# Patient Record
Sex: Female | Born: 1965
Health system: Southern US, Community
[De-identification: ages and names within clinical notes are randomized; demographics above are authoritative.]

## PROBLEM LIST (undated history)

## (undated) DIAGNOSIS — K219 Gastro-esophageal reflux disease without esophagitis: Secondary | ICD-10-CM

## (undated) DIAGNOSIS — K635 Polyp of colon: Secondary | ICD-10-CM

## (undated) DIAGNOSIS — R112 Nausea with vomiting, unspecified: Secondary | ICD-10-CM

## (undated) DIAGNOSIS — Z9889 Other specified postprocedural states: Secondary | ICD-10-CM

## (undated) HISTORY — PX: ANTERIOR AND POSTERIOR VAGINAL REPAIR: SUR5

## (undated) HISTORY — PX: BLADDER SUSPENSION: SHX72

## (undated) HISTORY — PX: DILATION AND CURETTAGE OF UTERUS: SHX78

## (undated) HISTORY — PX: WISDOM TOOTH EXTRACTION: SHX21

## (undated) HISTORY — PX: TONSILLECTOMY: SUR1361

## (undated) HISTORY — PX: ABDOMINAL HYSTERECTOMY: SHX81

---

## 2000-01-25 ENCOUNTER — Other Ambulatory Visit: Admission: RE | Admit: 2000-01-25 | Discharge: 2000-01-25 | Payer: Self-pay | Admitting: Obstetrics and Gynecology

## 2000-08-09 ENCOUNTER — Inpatient Hospital Stay (HOSPITAL_COMMUNITY): Admission: AD | Admit: 2000-08-09 | Discharge: 2000-08-11 | Payer: Self-pay | Admitting: Obstetrics and Gynecology

## 2000-10-25 ENCOUNTER — Encounter (INDEPENDENT_AMBULATORY_CARE_PROVIDER_SITE_OTHER): Payer: Self-pay

## 2000-10-25 ENCOUNTER — Other Ambulatory Visit: Admission: RE | Admit: 2000-10-25 | Discharge: 2000-10-25 | Payer: Self-pay | Admitting: Obstetrics and Gynecology

## 2000-11-09 ENCOUNTER — Other Ambulatory Visit: Admission: RE | Admit: 2000-11-09 | Discharge: 2000-11-09 | Payer: Self-pay | Admitting: Obstetrics and Gynecology

## 2000-11-09 ENCOUNTER — Encounter (INDEPENDENT_AMBULATORY_CARE_PROVIDER_SITE_OTHER): Payer: Self-pay

## 2001-03-12 ENCOUNTER — Other Ambulatory Visit: Admission: RE | Admit: 2001-03-12 | Discharge: 2001-03-12 | Payer: Self-pay | Admitting: Obstetrics and Gynecology

## 2001-07-02 ENCOUNTER — Other Ambulatory Visit: Admission: RE | Admit: 2001-07-02 | Discharge: 2001-07-02 | Payer: Self-pay | Admitting: Obstetrics and Gynecology

## 2001-09-24 ENCOUNTER — Encounter (INDEPENDENT_AMBULATORY_CARE_PROVIDER_SITE_OTHER): Payer: Self-pay | Admitting: *Deleted

## 2001-09-24 ENCOUNTER — Observation Stay (HOSPITAL_COMMUNITY): Admission: RE | Admit: 2001-09-24 | Discharge: 2001-09-25 | Payer: Self-pay | Admitting: Obstetrics and Gynecology

## 2002-04-12 ENCOUNTER — Ambulatory Visit (HOSPITAL_COMMUNITY): Admission: RE | Admit: 2002-04-12 | Discharge: 2002-04-12 | Payer: Self-pay | Admitting: Infectious Diseases

## 2002-04-12 ENCOUNTER — Encounter: Payer: Self-pay | Admitting: Infectious Diseases

## 2002-09-23 ENCOUNTER — Other Ambulatory Visit: Admission: RE | Admit: 2002-09-23 | Discharge: 2002-09-23 | Payer: Self-pay | Admitting: Obstetrics and Gynecology

## 2003-09-21 ENCOUNTER — Observation Stay (HOSPITAL_COMMUNITY): Admission: RE | Admit: 2003-09-21 | Discharge: 2003-09-22 | Payer: Self-pay | Admitting: Obstetrics and Gynecology

## 2003-11-03 ENCOUNTER — Other Ambulatory Visit: Admission: RE | Admit: 2003-11-03 | Discharge: 2003-11-03 | Payer: Self-pay | Admitting: Obstetrics and Gynecology

## 2004-07-05 ENCOUNTER — Other Ambulatory Visit: Admission: RE | Admit: 2004-07-05 | Discharge: 2004-07-05 | Payer: Self-pay | Admitting: Obstetrics and Gynecology

## 2005-08-03 ENCOUNTER — Other Ambulatory Visit: Admission: RE | Admit: 2005-08-03 | Discharge: 2005-08-03 | Payer: Self-pay | Admitting: Obstetrics and Gynecology

## 2005-10-31 ENCOUNTER — Ambulatory Visit: Payer: Self-pay | Admitting: Internal Medicine

## 2005-11-27 ENCOUNTER — Ambulatory Visit: Payer: Self-pay | Admitting: Internal Medicine

## 2005-11-27 ENCOUNTER — Ambulatory Visit (HOSPITAL_COMMUNITY): Admission: RE | Admit: 2005-11-27 | Discharge: 2005-11-27 | Payer: Self-pay | Admitting: Internal Medicine

## 2006-04-26 ENCOUNTER — Encounter: Admission: RE | Admit: 2006-04-26 | Discharge: 2006-04-26 | Payer: Self-pay | Admitting: Physical Therapy

## 2009-05-19 ENCOUNTER — Ambulatory Visit: Payer: Self-pay | Admitting: Vascular Surgery

## 2010-10-25 NOTE — Consult Note (Signed)
NEW PATIENT CONSULTATION   Donaghey, Frederick A  DOB:  1966-01-31                                       05/19/2009  ZOXWR#:[id number]   The patient presents today for evaluation of bilateral spider vein  telangiectasia.  She is an otherwise healthy 43-year white female with  progressive areas of spider vein telangiectasia bilaterally.  She  reports these have been present for many years and have become more  progressive in their nature throughout the years.  She denies any pain  associated with these.  She does note that they have been progressive,  both on her thighs and on her calves.  She does not have any  varicosities or history of bleeding or swelling.   PAST HISTORY:  Negative for hypertension, diabetes, cardiac disease.   FAMILY HISTORY:  Positive for some telangiectasia in a maternal  grandmother, otherwise negative.  She does not have any history of  premature atherosclerotic disease.   SOCIAL HISTORY:  She is married with 2 children.  She works as an Charity fundraiser.  She does not smoke or drink alcohol.   REVIEW OF SYSTEMS:  Weight is reported at 184 pounds.  She is 5 feet 5  inches tall.  She denies cardiac, pulmonary, GI or GU dysfunction.   PHYSICAL EXAMINATION:  Well-developed, well-nourished white female,  appearing stated age of 54.  Blood pressure 120/77, pulse 69,  respirations 16.  Her radial and dorsalis pedis pulses are 2+  bilaterally.  Extraocular movements are intact.  Conjunctivae are  normal.  Her musculoskeletal is noted for no major deformities, no  cyanosis and no edema.  Neurologically, she has no focal deficits.  Skin  has no ulcers or rashes.  She does have telangiectasia over her lateral  thighs and calves bilaterally and some lesser so in her  medial areas.    She underwent screening duplex by myself and this showed normal caliber  saphenous vein with no evidence of gross reflux.  She did have reticular  veins in the subcutaneous tissue  feeding these areas of spider vein  telangiectasia.   I discussed the significance of this with the patient.  I explained that  this does not put her at any increased risk for major more significant  venous pathology.  I did explain the treatment options for cosmetic  concerns regarding the appearance of her telangiectasia.  I explained  the option of sclerotherapy which would be done as an outpatient and  also discussed the estimated costs associated with this.  She  understands and will notify us should she wish to pursue this any  further.  She was  given contact information for Clementeen Hoof, RN.   Larina Earthly, M.D.  Electronically Signed   TFE/MEDQ  D:  05/19/2009  T:  05/20/2009  Job:  [id number]

## 2010-10-28 NOTE — H&P (Signed)
[facility]  Patient:    Monica Simpson, REDDITT Visit Number: [id number] MRN: [id number]          Service Type: DSU Location: [id number] [phone] Attending Physician:  Trevor Iha Dictated by:   Trevor Iha, M.D. Admit Date:  09/24/2001                           History and Physical  HISTORY OF PRESENT ILLNESS:   Ms. Monica Simpson is a 45 year old G4, P2, A2 with recurrent cervical dysplasia.  She has had history of abnormal Pap smears, dating back to 1996; has undergone two cryosurgery procedures.  Had recurrence of dysplasia in August 2001, with high-grade squamous dysplasia during pregnancy.  She underwent a LEEP procedure in May 2002, which had negative margins and high-grade dysplasia.  After having a normal Pap smear, returned to low-grade dysplasia in October 2002.  HPV typing at that time showed a high risk HPV type, despite two treatments which were initially successful, but recurrence of high-grade HPV and recurrent dysplasia.  The patient desires a hysterectomy.  Both she and her husband were adamant about the hysterectomy and do not wish to address other treatment options; they wish to be treated with hysterectomy.  She presents today for this.  PAST MEDICAL HISTORY:         Negative.  PAST SURGICAL HISTORY:        Tonsillectomy and wisdom teeth extraction.  PAST OB HISTORY:              She had a full-term stillborn in 1988, otherwise she has had two vaginal deliveries and miscarriage at 10 weeks.  MEDICATIONS:                  None.  ALLERGIES:                    No known drug allergies.  PHYSICAL EXAMINATION:  VITAL SIGNS:                  Blood pressure 110/78.  HEART:                        Regular rate and rhythm.  LUNGS:                        Clear to auscultation bilaterally.  ABDOMEN:                      Nondistended and nontender.  PELVIC:                       Uterus is anteverted, mobile and nontender. Cervix is  grossly within normal limits, status post LEEP procedure.  IMPRESSION AND PLAN:          Recurrent high-grade dysplasia, refractory to several treatments.  After a lengthy discussion with the patient and her husband, they desire hysterectomy.  Plan for laparoscopic-assisted vaginal hysterectomy with preservation of both ovaries and tubes.  The risks and benefits were discussed at length with the patient and her husband.  These include, but not limited to, risks of infection, bleeding, damage to ureters, tubes, ovaries, bowel or bladder, risks associated with blood loss and blood transfusion; also, the risk of recurrent vaginal dysplasia after removal of the uterus.  They did give their informed consent. Dictated by:  Trevor Iha, M.D. Attending Physician:  Trevor Iha DD:  09/24/01 TD:  09/24/01 Job: [id number]

## 2010-10-28 NOTE — H&P (Signed)
NAME:  Monica Simpson, Monica Simpson                        ACCOUNT NO.:  [id number]   MEDICAL RECORD NO.:  [id number]                   PATIENT TYPE:  AMB   LOCATION:  DAY                                  FACILITY:  [facility]   PHYSICIAN:  Dineen Kid. Rana Snare, M.D.                 DATE OF BIRTH:  January 04, 1966   DATE OF ADMISSION:  DATE OF DISCHARGE:                                HISTORY & PHYSICAL   HISTORY OF PRESENT ILLNESS:  Monica Simpson is a 45 year old G4, P2, A2, with  symptomatic pelvic relaxation and stress urinary incontinence who presents  for surgical treatment of this.  She previously had a hysterectomy in 2003  for recurrent dysplasia at which time she did not have any symptoms of  urinary incontinence or of pelvic relaxation, but subsequently has developed  that over the last two years.  It has gotten to a point where she is having  to wear a pad on a daily basis and describes pressure in the vascular area  after standing for prolonged periods of time as well as with lifting or  straining.  She has been evaluated by Dr. Retta Diones for stress urinary  incontinence which originally appeared to be mixed, but mostly stress  urinary incontinence and planned to perform a SPARC procedure.  Because of  her cystocele and rectocele we plan on doing this as a combined procedure.   PAST MEDICAL HISTORY:  Negative.   PAST SURGICAL HISTORY:  1. Tonsillectomy.  2. Wisdom teeth extraction.  3. Laparoscopically assisted vaginal hysterectomy.   MEDICATIONS:  None.   ALLERGIES:  She has no known drug allergies.   PHYSICAL EXAMINATION:  VITAL SIGNS:  Her blood pressure is 102/64.  HEART:  Regular rate and rhythm.  LUNGS:  Clear to auscultation bilaterally.  ABDOMEN:  Nondistended, nontender.  PELVIC:  Normal external genitalia.  __________urethra.  The vagina shows a  third degree cystocele with loss of the UV angle with Valsalva.  She has  minimal descensus of the vascular apex, first degree rectocele,  and good  rectal and perianal tone.   IMPRESSION/PLAN:  1. Symptomatic pelvic relaxation with stress urinary incontinence.     Discussed the procedure at length with her which includes anterior and     posterior colporrhaphy.  Dr. Retta Diones is performing a urinary suspension     procedure and she has been evaluated by him and considered by him as     well.  Discussed the risks and benefits of the procedure which include,     but not limited to, risk of infection, bleeding, damage to bowel,     bladder, ureters, rectum, risk of blood loss, risk associated with     anesthesia, risk associated with blood transfusion, such as     hepatitis A.  There is also a risk that this procedure may not last.  It     could become a recurrent  problem.  We also discussed the fact that she     may need to avoid heavy lifting or straining in the future.  All of her     questions were answered and informed consent was obtained.                                               Dineen Kid Rana Snare, M.D.    DCL/MEDQ  D:  09/20/2003  T:  09/20/2003  Job:  [id number]

## 2010-10-28 NOTE — H&P (Signed)
Monica Simpson, Monica Simpson              ACCOUNT NO.:  [id number]   MEDICAL RECORD NO.:  [id number]           PATIENT TYPE:   LOCATION:                                 FACILITY:   PHYSICIAN:  Lionel December, M.D.    DATE OF BIRTH:  25-May-1966   DATE OF ADMISSION:  10/31/2005  DATE OF DISCHARGE:  LH                                HISTORY & PHYSICAL   CHIEF COMPLAINT:  Diarrhea, abdominal cramping, family history of colon  cancer and polyps.   HISTORY OF PRESENT ILLNESS:  Monica Simpson is a 45 year old Caucasian female who  presents as a self-referral for further evaluation of significant family  history of colorectal cancer and colonic polyps as well as a chronic history  of predominance of diarrhea.  Sometimes, she does have some constipation but  generally she has three or four loose stools daily.  Denies any melena or  rectal bleeding.  She has abdominal cramping relieved with defecation. No  nausea or vomiting, heartburn or indigestion.  No recent weight loss.  She  has never had a colonoscopy or work-up of her diarrhea.  No dysphagia or  odynophagia.   FAMILY HISTORY:  Quite significant for colorectal cancer and polyps.  Her  mother's sister was diagnosed with colon cancer at age 45 and died of  metastatic disease at age 100.  Her mother started having her colonoscopies  in her mid 36's and has always had multiple polyps removed.  Maternal great-  grandmother died of colon cancer.  Her brother has had polyps removed at age  36.  She has had multiple cousins with polyps at young ages.   SOCIAL HISTORY:  She is married. 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. [employer].  She has never been a smoker.  No alcohol use.  She has  two living children.   REVIEW OF SYSTEMS:  See HPI for GI and constitutional.  CARDIOPULMONARY:  No  chest pain or shortness of breath.   PHYSICAL EXAMINATION:  GENERAL APPEARANCE:  A pleasant, well-developed, well-  nourished Caucasian female in no acute  distress.  VITAL SIGNS:  Weight 206.5, height 5 feet 6 inches, temperature 98.2, blood  pressure 116/70, pulse 64.  SKIN:  Warm and dry, no jaundice.  HEENT:  Pupils are equal, round and reactive to light.  Conjunctivae are  pink.  Sclerae are nonicteric.  Oropharyngeal mucosa moist and pink.  No  lesions, erythema or exudates.  NECK:  No lymphadenopathy or thyromegaly.  CHEST:  Lungs are clear to auscultation.  CARDIOVASCULAR:  Regular rate and rhythm, normal S1 and S2, no murmurs,  rubs, or gallops.  ABDOMEN:  Positive bowel sounds, soft and nontender, nondistended, no  organomegaly or masses.  No rebound tenderness or guarding.  No abdominal  bruits or hernias.  EXTREMITIES:  No edema.   IMPRESSION:  Monica Simpson is a 45 year old lady with chronic predominance of  diarrhea, sometimes constipation and a significant family history of  colorectal cancer and colonic polyps.  She has not had prior work-up before.  Symptoms have been stable over  the years but she is quite concerned about  the possibility of her developing colorectal polyps or colorectal cancer  given significant family history.   PLAN:  1.  Colonoscopy in the near future with Dr. Karilyn Cota.  2.  She may continue using Imodium p.r.n. as needed for diarrhea.  3.  Further recommendations to follow.      Tana Coast, P.A.      Lionel December, M.D.  Electronically Signed    LL/MEDQ  D:  10/31/2005  T:  10/31/2005  Job:  [id number]   cc:   Doreen Beam  Fax: [phone]

## 2010-10-28 NOTE — Op Note (Signed)
[facility]  Patient:    Monica Simpson, Monica Simpson Visit Number: [id number] MRN: [id number]          Service Type: DSU Location: [phone] Attending Physician:  Trevor Iha Dictated by:   Trevor Iha, M.D. Proc. Date: 09/24/01 Admit Date:  09/24/2001                             Operative Report  PREOPERATIVE DIAGNOSIS:       Recurrent cervical dysplasia.  POSTOPERATIVE DIAGNOSIS:      Recurrent cervical dysplasia.  PROCEDURE:                    Laparoscopically assisted vaginal hysterectomy.  SURGEON:                      Trevor Iha, M.D.  ASSISTANT:                    Luvenia Redden, M.D.  ANESTHESIA:                   General endotracheal.  INDICATIONS:                  The patient is a 45 year old G4, P2 with two living children who has had recurrent cervical dysplasia dating back to 1996 refractory to cryosurgery and LEEP procedure.  This is high risk HPV type. She presents today for definitive surgical intervention and treatment with a planned laparoscopically assisted vaginal hysterectomy.  The risks and benefits were discussed at length and informed consent was obtained.  See the history and physical for further details.  FINDINGS AT THE TIME OF SURGERY:              Normal tubes, ovaries, uterus, appendix and liver.  DESCRIPTION OF PROCEDURE:     After adequate analgesia, the patient was placed in the dorsal lithotomy position.  She was sterilely prepped and draped. T he bladder was sterilely drained.  A Graves speculum was placed.  A tenaculum was placed on the anterior lip of the cervix.  A 1 cm infraumbilical skin incision was made.  A Veress needle was inserted.  The abdomen was insufflated to dullness to percussion.  A 5 mm port was placed left of midline two fingerbreadths above the pubic symphysis.  A 5 mm trocar was inserted with direct visualization.  The above findings were noted.  A tripolar  instrument was placed and cautery was performed across the utero-ovarian ligament, the round ligament and the fallopian tube.  This was taken down to the base of the broad ligament.  This was done bilaterally, with care taken to avoid underlying bowel.  Good hemostasis was achieved at all levels.  At this time, the bladder was elevated.  Sharp Endoshears were used to dissect the bladder off the anterior surface of the cervix.  At this time, the abdomen was desufflated.  The legs were elevated.  A weighted speculum was placed.  A posterior colpotomy was performed.  The cervix was circumscribed with Bovie cautery.  A LigaSure instrument was used across the uterosacral ligaments bilateral, across the cardinal ligaments and across the uterine vasculature. We encountered some bleeding due to the fact that the first LigaSure instrument did not work properly.  After replacing the instrument, we had good seal and good hemostasis at that time.  The bladder was then  dissected off the anterior surface of the cervix.  A Deaver retractor was placed underneath the bladder.  The bladder pillars were then dissected with the LigaSure instrument and cut.  The remaining portions of the uterine vasculature were sealed with the LigaSure instrument and cut.  At this time, the fundus of the uterus was delivered into the introitus.  The remaining pedicle was then ligated and cut and the uterus was removed intact.  At this time, the uterosacral ligament were identified bilaterally and suture ligated with figure-of-eight of 0 Monocryl.  A small ______ pack was placed, packing the bowel cephalad.  The posterior peritoneum was then closed in a pursestring fashion.  The posterior vaginal mucosa was then closed in a vertical fashion using figure-of-eight of 0 Monocryl with good approximation and good hemostasis.  The packing was then removed.  The anterior vaginal mucosa was then closed with figure-of-eight of 0  Monocryl in a vertical fashion with good approximation and good hemostasis. A Foley catheter was placed with good return of clear, yellow urine. Reinsufflation of the abdomen was performed and examination using the laparoscope revealed some small areas of peritoneal edge bleeding, which were cauterized with bipolar cautery.  After adequate hemostasis was assured, the pedicles were re-examined and good hemostasis had been assured.  The abdomen was desufflated.  One last look after the pneumoperitoneum was removed again showed good hemostasis.  The trocars were then removed.  The infraumbilical skin incision was closed with 0 Vicryl UR needle in the fascia in a figure-of-eight, then a 3-0 Vicryl Rapide subcuticular stitch in the skin. The 5 mm port was closed with 3-0 Vicryl Rapide subcuticular stitch.  The incisions were injected with 0.25% Marcaine.  The patient was stable on transfer to the recovery room.  Sponge, needle and instrument counts were normal x3.  The patient received 1 g of cefotetan preoperatively.  Estimated blood loss was 400 cc. Dictated by:   Trevor Iha, M.D. Attending Physician:  Trevor Iha DD:  09/24/01 TD:  09/24/01 Job: [phone]

## 2010-10-28 NOTE — Op Note (Signed)
NAME:  Monica Simpson, Monica Simpson                        ACCOUNT NO.:  [id number]   MEDICAL RECORD NO.:  [id number]                   PATIENT TYPE:  AMB   LOCATION:  DAY                                  [facility]ACILITY[facility]  [facility]   PHYSICIAN:  Bertram Millard. Dahlstedt, M.D.          DATE OF BIRTH:  Oct 13, 1965   DATE OF PROCEDURE:  09/21/2003  DATE OF DISCHARGE:                                 OPERATIVE REPORT   PREOPERATIVE DIAGNOSIS:  Stress urinary incontinence.   POSTOPERATIVE DIAGNOSIS:  Stress urinary incontinence.   PRINCIPAL PROCEDURE:  Obturator tape sling.   SURGEON:  Bertram Millard. Dahlstedt, M.D.   ANESTHESIA:  General endotracheal.   COMPLICATIONS:  None.   BRIEF HISTORY:  This nice 45 year old female was referred by Dr. Rana Snare for  evaluation and management of stress urinary incontinence.  She was scheduled  to have an anterior and posterior repair.  The patient had a brief  evaluation for her incontinence.  She does not have overriding urgency  symptoms.   The patient desires surgical management of her stress incontinence.  She was  explained the surgical repair, specifically minimally invasive surgery. She  is aware of the risks and complications.  These include but are not limited  to erosion of the tape sling through the vaginal wall, bleeding, infection,  retention with need for repeat procedure, injury to the urinary tract among  others.  She understands these and desires to proceed.   DESCRIPTION OF PROCEDURE:  The patient had been administered a general  anesthetic and had undergone the anterior and posterior repair by Dr. Rana Snare.  He left the anterior vaginal wall, at least the distal portion of it,  unsutured.  With the patient having already been placed in the dorsal  lithotomy position, I palpated the tendon from the adductor longus,  attaching to the pubis.  I then made puncture wounds just underneath of  these bilaterally, lateral to the pubic bone.  This was approximately 5 cm  lateral to the clitoris on either side.  Dissection was then carried bluntly  through the vaginal wall laterally on each side underneath the pubic bone.  The helical needles were then passed bilaterally through the puncture sites.  These were easily guided through the vaginal incision.  I then used a  cystoscope to view the bladder and the urethra.  No injury was seen.  The  obturator tape was then passed through the eye of the needles bilaterally  and brought through the puncture incisions.  Approximately 1 cm of slack was  left underneath the mid urethra, between the mid urethra and the tape.  The  tape was then cut and buried in the subcutaneous tissue bilaterally.  Skin  wounds were closed with Dermabond.  The vaginal incision was then closed  with a running 2-0 Monocryl.  A vaginal pack was placed with Estrace cream.  No bleeding was seen prior to closure of the wound.  The patient tolerated the procedure well.  She was awakened and transferred  to the PACU in stable condition.   She will be left with a catheter over night and will have a voiding trial in  the morning.                                               Bertram Millard. Dahlstedt, M.D.    SMD/MEDQ  D:  09/21/2003  T:  09/21/2003  Job:  [id number]   cc:   Dineen Kid. Rana Snare, M.D.  [address]  [place]  [place] [address]  Fax: [phone]

## 2010-10-28 NOTE — Op Note (Signed)
[facility]  Patient:    Monica Simpson, Monica Simpson                       MRN: [id number] Proc. Date: 08/09/00 Adm. Date:  [id number] Disc. Date: [id number] Attending:  Trevor Iha                           Operative Report  DELIVERY NOTE  Ms. Buffalo pushed for an approximate hour, hour and 15 minutes with descent to right occiput anterior position and +2 station. For maternal exhaustion, the patient is requesting vacuum extractor. Risk and benefits of the VE were discussed at length. Informed consent was obtained.  The Mushroom Vacuum Extractor was placed easily at a +2 station and with one pull the vertex was easily delivered, at which time turtle sign was encountered. The vacuum was removed. McRoberts maneuver was performed and a Woods screw maneuver was also performed without releasing of the impacted shoulder. The operators hand was placed in to deliver the posterior arm and the infant then delivered and a good cry was noted. Apgars were 8 and 9. The placenta delivered spontaneously intact with three-vessel cord. Cervix and vagina were without lacerations. The mother and baby were in good condition after the delivery. The estimated blood loss was 400 cc. DD:  08/09/00 TD:  08/11/00 Job: [id number]

## 2010-10-28 NOTE — Op Note (Signed)
Monica Simpson, Monica Simpson              ACCOUNT NO.:  [id number]   MEDICAL RECORD NO.:  [id number]          PATIENT TYPE:  AMB   LOCATION:  DAY                           FACILITY:  [facility]   PHYSICIAN:  Lionel December, M.D.    DATE OF BIRTH:  01/20/1966   DATE OF PROCEDURE:  11/27/2005  DATE OF DISCHARGE:                                 OPERATIVE REPORT   PROCEDURE:  Colonoscopy with terminal ileoscopy.   INDICATIONS:  Monica Simpson is 45 year old Caucasian female with chronic diarrhea  suspected to be due to IBS, who has family history of colonic polyps.  Her  mother and brother have had multiple polyps removed; and maternal aunt was  diagnosed with colon carcinoma at age 16 and died of metastatic disease a  few weeks later.  Multiple second-degree relatives have had colonic polyps.  She is undergoing colonoscopy both for diagnostic and high-risk screening  purpose purposes.   Procedure and risks were reviewed with the patient and informed consent was  obtained.   MEDS FOR CONSCIOUS SEDATION:  Demerol 50 mg IV, Versed 5 mg IV.   FINDINGS:  Procedure performed in endoscopy suite.  The patient's vital  signs and O2 saturations were monitored during the procedure and remained  stable.  The patient was placed in the left lateral position and rectal  examination performed.  No abnormality noted on external or digital exam.  Olympus videoscope was placed in the rectum and advanced, under vision, into  sigmoid colon and beyond.  Preparation was excellent.  Scope was passed into  the cecum which was identified by ileocecal valve and appendiceal orifice.  Pictures taken for the record.  A short segment of TI was also examined and  was normal.   As the scope was withdrawn, colonic mucosa was examined for the second time;  and was normal throughout.  No diverticular changes were noted.  Rectal  mucosa was normal.  Scope was retroflexed to examine anorectal junction  which was unremarkable.  Endoscope was  straightened and withdrawn.  Patient  tolerated the procedure well.   Normal colonoscopy and terminal ileoscopy.   RECOMMENDATIONS:  1.  High-fiber diet.  2.  Levbid 1/2 to 1 tablet every morning, prescription given for 30 with      five refills.  If Levbid does not help with her diarrhea and abdominal      cramps; she will let us know.  3.  Yearly Hemoccults; and consider next exam in 5 years from now.      Lionel December, M.D.  Electronically Signed     NR/MEDQ  D:  11/27/2005  T:  11/27/2005  Job:  [id number]   cc:   Doreen Beam  Fax: [phone]

## 2010-10-28 NOTE — Discharge Summary (Signed)
[facility]  Patient:    Monica Simpson, Monica Simpson Visit Number: [id number] MRN: [id number]          Service Type: DSU Location: [id number] Attending Physician:  Trevor Iha Dictated by:   Trevor Iha, M.D. Admit Date:  09/24/2001 Discharge Date: 09/25/2001                             Discharge Summary  HISTORY OF PRESENT ILLNESS:  Monica Simpson is a 45 year old, G4, P2, with two living children who has recurrent cervical dysplasia dating back to 1996, refractory to prior surgery and loop electrosurgical excision procedure.  She does have high risk HPD type.  She presents for definitive surgical intervention and treatment with a planned laparoscopic assisted vaginal hysterectomy.  See history and physical for further details.  HOSPITAL COURSE:  The patient underwent a laparoscopic assisted vaginal hysterectomy.  The surgery was uncomplicated.  At the time of surgery, normal tubes, uterus, ovaries, appendix, and liver were noted.  She did have an estimated blood loss of 400 cc.  The postoperative care was unremarkable.  She had a good return of bowel function, ambulation.  Postoperative hemoglobin is 11.6.  She remained afebrile, and on postoperative day #1, was tolerating a regular diet, ambulating without difficulty, passing flatus, had normal active bowel sounds, the incisions were clean, dry, and intact.  DISPOSITION:  The patient is discharged home.  FOLLOWUP:  In the office in one to two weeks.  DISCHARGE MEDICATIONS:  Tylox #30.  DISCHARGE INSTRUCTIONS:  Told to return for increased pain, fever, bleeding.  CONDITION ON DISCHARGE:  Good. Dictated by:   Trevor Iha, M.D. Attending Physician:  Trevor Iha DD:  09/25/01 TD:  09/26/01 Job: [id number]

## 2010-10-28 NOTE — Op Note (Signed)
NAME:  Monica Simpson, Monica Simpson                        ACCOUNT NO.:  [id number]   MEDICAL RECORD NO.:  [id number]                   PATIENT TYPE:  AMB   LOCATION:  DAY                                  FACILITY:  [facility]   PHYSICIAN:  Dineen Kid. Rana Snare, M.D.                 DATE OF BIRTH:  April 13, 1966   DATE OF PROCEDURE:  09/21/2003  DATE OF DISCHARGE:                                 OPERATIVE REPORT   PREOPERATIVE DIAGNOSES:  Symptomatic pelvic relaxation with cystocele and  rectocele and stress urinary incontinence.   POSTOPERATIVE DIAGNOSES:  Symptomatic pelvic relaxation with cystocele and  rectocele and stress urinary incontinence.   PROCEDURE:  Anterior and posterior colporrhaphy with urinary incontinence  procedure performed by Dr. Retta Diones and dictated elsewhere.   SURGEON:  Dr. Candice Camp.   ASSISTANT:  Dr. Vincente Poli.   ANESTHESIA:  General endotracheal.   INDICATIONS:  Ms. Rochefort is a 45 year old, G3, P2, status post hysterectomy  2 years ago with worsening pelvic pressure with a known cystocele,  rectocele, and also now with stress urinary incontinence.  She presents for  definitive surgical evaluation and treatment and planned anterior and  posterior repair with obturator sling per Dr. Retta Diones.  Risks and benefits  were discussed at length.  Informed consent was obtained.  See history and  physician for further details.   FINDINGS AT TIME OF SURGERY:  A third-degree cystocele and a second-degree  rectocele with loss of UV angle.   DESCRIPTION OF PROCEDURE:  After adequate analgesia, the patient was placed  in the dorsal lithotomy position.  She was sterilely prepped and draped.  The bladder was sterilely drained.  Apex of the vagina was grasped with  Allis clamps.  Mid portion was dissected using Metzenbaum scissors, and the  anterior vaginal mucosa was reflected laterally up to approximately 1.5 cm  in the urethral meatus.  __________ reflected laterally, the cystocele was  reduced by placating the pubic and fascicular cervical fascia near the  __________ with figure-of-eight of 0 Monocryl suture up to the urethra.  Excess vaginal mucosa was then excised, and the anterior vaginal mucosa was  then closed with a combination of running suture of 0 Monocryl suture and  then interrupted sutures of 0 Monocryl suture near the cystourethrocele.  The posterior repair was begun by placing Allis clamps at the edge of the  hymenal ring, making a triangular flap across the perineal body, dissecting  it in midline of the posterior vaginal mucosa, reflecting laterally off the  perirectal fascia.  The perirectal fascia was then plicated in the midline  using figure-of-eight of 0 Monocryl suture.  The excess vaginal mucosa was  excised, and the posterior repair was closed with 2-0 Monocryl suture in a  running locking fashion.  The bulbocavernosus with superficial transverse  perineum was sutured and plicated with 0 Monocryl suture in a figure-of-  eight, and the perineal body was  then closed with 2-0 Monocryl suture in the  subcuticular fashion with good approximation and good hemostasis noted.  Pelvic exam at this point of the procedure revealed good cystocele support  and also rectocele support.  The procedure was then  turned over to Dr. Retta Diones for the urinary incontinence procedure.  The  estimated blood loss to this point was 100 mL.  The patient tolerated the  procedure well, was stable up to this point.  The sponge and instrument  count was normal x 3.                                               Dineen Kid Rana Snare, M.D.    DCL/MEDQ  D:  09/21/2003  T:  09/21/2003  Job:  [id number]

## 2010-12-15 ENCOUNTER — Other Ambulatory Visit: Payer: Self-pay | Admitting: Dermatology

## 2011-01-18 ENCOUNTER — Telehealth (INDEPENDENT_AMBULATORY_CARE_PROVIDER_SITE_OTHER): Payer: Self-pay | Admitting: *Deleted

## 2011-01-18 NOTE — Telephone Encounter (Signed)
TCS sch'd 02/23/11 at 2:00 (1:00), movi prep instr mailed

## 2011-02-09 ENCOUNTER — Encounter (INDEPENDENT_AMBULATORY_CARE_PROVIDER_SITE_OTHER): Payer: Self-pay | Admitting: Internal Medicine

## 2011-02-22 MED ORDER — SODIUM CHLORIDE 0.45 % IV SOLN
Freq: Once | INTRAVENOUS | Status: AC
  Administered 2011-02-23: 14:00:00 via INTRAVENOUS

## 2011-02-23 ENCOUNTER — Other Ambulatory Visit (INDEPENDENT_AMBULATORY_CARE_PROVIDER_SITE_OTHER): Payer: Self-pay | Admitting: Internal Medicine

## 2011-02-23 ENCOUNTER — Encounter (HOSPITAL_COMMUNITY)
Admission: RE | Disposition: A | Discharge status: Home / Self Care | Payer: Self-pay | Source: Ambulatory Visit | Attending: Internal Medicine

## 2011-02-23 ENCOUNTER — Encounter (HOSPITAL_COMMUNITY): Payer: Self-pay | Admitting: *Deleted

## 2011-02-23 ENCOUNTER — Ambulatory Visit (HOSPITAL_COMMUNITY)
Admission: RE | Admit: 2011-02-23 | Discharge: 2011-02-23 | Disposition: A | Discharge status: Home / Self Care | Payer: 59 | Source: Ambulatory Visit | Attending: Internal Medicine | Admitting: Internal Medicine

## 2011-02-23 DIAGNOSIS — Z1211 Encounter for screening for malignant neoplasm of colon: Secondary | ICD-10-CM

## 2011-02-23 DIAGNOSIS — D126 Benign neoplasm of colon, unspecified: Secondary | ICD-10-CM | POA: Insufficient documentation

## 2011-02-23 DIAGNOSIS — Z800 Family history of malignant neoplasm of digestive organs: Secondary | ICD-10-CM

## 2011-02-23 HISTORY — PX: COLONOSCOPY: SHX5424

## 2011-02-23 SURGERY — COLONOSCOPY
Anesthesia: Moderate Sedation

## 2011-02-23 MED ORDER — MIDAZOLAM HCL 5 MG/5ML IJ SOLN
INTRAMUSCULAR | Status: DC | PRN
  Administered 2011-02-23: 2 mg via INTRAVENOUS
  Administered 2011-02-23: 1 mg via INTRAVENOUS
  Administered 2011-02-23: 2 mg via INTRAVENOUS

## 2011-02-23 MED ORDER — MEPERIDINE HCL 50 MG/ML IJ SOLN
INTRAMUSCULAR | Status: AC
  Filled 2011-02-23: qty 1

## 2011-02-23 MED ORDER — MIDAZOLAM HCL 5 MG/5ML IJ SOLN
INTRAMUSCULAR | Status: AC
  Filled 2011-02-23: qty 10

## 2011-02-23 MED ORDER — STERILE WATER FOR IRRIGATION IR SOLN
Status: DC | PRN
  Administered 2011-02-23: 14:00:00

## 2011-02-23 MED ORDER — MEPERIDINE HCL 50 MG/ML IJ SOLN
INTRAMUSCULAR | Status: DC | PRN
  Administered 2011-02-23 (×2): 25 mg via INTRAVENOUS

## 2011-02-23 NOTE — Op Note (Signed)
COLONOSCOPY PROCEDURE REPORT  PATIENT:  Monica Simpson  MR#:  [id number] Birthdate:  1966-02-28, 45 y.o., female Endoscopist:  Dr. Malissa Hippo, MD Referred By:  Dr. Ignatius Specking, MD Procedure Date: 02/23/2011  Procedure:   Colonoscopy  Indications:  Family history of colonic polyps and colon carcinoma.  Informed Consent: Procedure and risks were reviewed with the patient and informed consent was obtained.  Medications:  Demerol 50 mg IV Versed 5 mg IV  Description of procedure:  After a digital rectal exam was performed, that colonoscope was advanced from the anus through the rectum and colon to the area of the cecum, ileocecal valve and appendiceal orifice. The cecum was deeply intubated. These structures were well-seen and photographed for the record. Short segment of terminal ileum was also examined From the level of the cecum and ileocecal valve, the scope was slowly and cautiously withdrawn. The mucosal surfaces were carefully surveyed utilizing scope tip to flexion to facilitate fold flattening as needed. The scope was pulled down into the rectum where a thorough exam including retroflexion was performed.  Findings:   Prep excellent. Normal terminal ileum. 2 mm polyp ablated via cold biopsy from ascending colon. Rest of the mucosa was normal. Unremarkable anorectal junction.  Therapeutic/Diagnostic Maneuvers Performed:  See above  Complications:  None  Cecal Withdrawal Time:  14 minutes  Impression:  Normal terminal ileum. 2 mm polyp ablated via cold biopsy from ascending colon. Rest of exam was normal.   Recommendations:  Standard instructions given. Pthysician will contact patient with results of biopsy. Next exam in 5 years.  Zahniya Zellars U  02/23/2011 2:23 PM  CC: Dr. Ignatius Specking., MD, MD

## 2011-02-23 NOTE — H&P (Signed)
Monica Simpson is an 45 y.o. female.   Chief Complaint: Patient is here for colonoscopy. HPI: Monica Simpson is a 45 year old Caucasian female who is in for a screening colonoscopy. Patient's last exam was in June 2007 and was normal. At that time she was having diarrhea felt to be secondary to IBS. He denies abdominal pain or rectal bleeding. She has occasional diarrhea nothing like before. Family history is positive for multiple colonic polyps in her mother and brother. She lost her maternal onset of metastatic colon carcinoma in her 69s she was diagnosed at age 2. Great grandmother on mother's side also had colon carcinoma.  History reviewed. No pertinent past medical history.  Past Surgical History  Procedure Date  . Abdominal hysterectomy   . Tonsillectomy   . Wisdom tooth extraction   . Dilation and curettage of uterus   . Anterior and posterior vaginal repair     History reviewed. No pertinent family history. Social History:  reports that she has never smoked. She does not have any smokeless tobacco history on file. She reports that she does not drink alcohol or use illicit drugs.  Allergies:  Allergies  Allergen Reactions  . Morphine And Related Nausea And Vomiting    Medications Prior to Admission  Medication Dose Route Frequency Provider Last Rate Last Dose  . 0.45 % sodium chloride infusion   Intravenous Once Monica Hippo, MD 20 mL/hr at 02/23/11 1337    . meperidine (DEMEROL) 50 MG/ML injection           . midazolam (VERSED) 5 MG/5ML injection            No current outpatient prescriptions on file as of 02/23/2011.    No results found for this or any previous visit (from the past 48 hour(s)). No results found.  Review of Systems  Constitutional: Negative for weight loss.  Gastrointestinal: Negative for abdominal pain, constipation, blood in stool and melena. Diarrhea: occasional.    Blood pressure 106/62, pulse 71, temperature 97.9 F (36.6 C), temperature source  Oral, resp. rate 18, height 5\' 6"  (1.676 m), weight 207 lb (93.895 kg), SpO2 97.00%. Physical Exam  Constitutional: She appears well-developed and well-nourished.  HENT:  Mouth/Throat: Oropharynx is clear and moist.  Eyes: Conjunctivae are normal. No scleral icterus.  Neck: No thyromegaly present.  Cardiovascular: Normal rate, regular rhythm and normal heart sounds.   No murmur heard. Respiratory: Effort normal and breath sounds normal.  GI: Soft. She exhibits no distension and no mass. There is no tenderness.  Musculoskeletal: She exhibits no edema.  Lymphadenopathy:    She has no cervical adenopathy.  Neurological: She is alert.  Skin: Skin is warm and dry.     Assessment/Plan High-risk screening colonoscopy.  Monica Simpson U 02/23/2011, 1:52 PM

## 2011-03-02 ENCOUNTER — Encounter (HOSPITAL_COMMUNITY): Payer: Self-pay | Admitting: Internal Medicine

## 2011-03-07 ENCOUNTER — Encounter (INDEPENDENT_AMBULATORY_CARE_PROVIDER_SITE_OTHER): Payer: Self-pay | Admitting: *Deleted

## 2011-04-19 ENCOUNTER — Other Ambulatory Visit: Payer: Self-pay | Admitting: Obstetrics and Gynecology

## 2014-06-03 ENCOUNTER — Other Ambulatory Visit: Payer: Self-pay | Admitting: Obstetrics and Gynecology

## 2014-06-03 DIAGNOSIS — R928 Other abnormal and inconclusive findings on diagnostic imaging of breast: Secondary | ICD-10-CM

## 2014-06-18 ENCOUNTER — Ambulatory Visit
Admission: RE | Admit: 2014-06-18 | Discharge: 2014-06-18 | Disposition: A | Discharge status: Home / Self Care | Payer: 59 | Source: Ambulatory Visit | Attending: Obstetrics and Gynecology | Admitting: Obstetrics and Gynecology

## 2014-06-18 DIAGNOSIS — R928 Other abnormal and inconclusive findings on diagnostic imaging of breast: Secondary | ICD-10-CM

## 2014-12-31 ENCOUNTER — Other Ambulatory Visit: Payer: Self-pay | Admitting: Obstetrics and Gynecology

## 2015-01-01 LAB — CYTOLOGY - PAP

## 2015-12-28 DIAGNOSIS — H524 Presbyopia: Secondary | ICD-10-CM | POA: Diagnosis not present

## 2015-12-28 DIAGNOSIS — H5203 Hypermetropia, bilateral: Secondary | ICD-10-CM | POA: Diagnosis not present

## 2015-12-28 DIAGNOSIS — H52221 Regular astigmatism, right eye: Secondary | ICD-10-CM | POA: Diagnosis not present

## 2016-01-20 ENCOUNTER — Other Ambulatory Visit (INDEPENDENT_AMBULATORY_CARE_PROVIDER_SITE_OTHER): Payer: Self-pay | Admitting: *Deleted

## 2016-01-20 DIAGNOSIS — Z800 Family history of malignant neoplasm of digestive organs: Secondary | ICD-10-CM

## 2016-01-20 DIAGNOSIS — Z86010 Personal history of colonic polyps: Secondary | ICD-10-CM | POA: Insufficient documentation

## 2016-02-28 ENCOUNTER — Encounter (INDEPENDENT_AMBULATORY_CARE_PROVIDER_SITE_OTHER): Payer: Self-pay | Admitting: *Deleted

## 2016-03-17 DIAGNOSIS — R5383 Other fatigue: Secondary | ICD-10-CM | POA: Diagnosis not present

## 2016-03-17 DIAGNOSIS — Z299 Encounter for prophylactic measures, unspecified: Secondary | ICD-10-CM | POA: Diagnosis not present

## 2016-03-17 DIAGNOSIS — Z0000 Encounter for general adult medical examination without abnormal findings: Secondary | ICD-10-CM | POA: Diagnosis not present

## 2016-03-17 DIAGNOSIS — Z79899 Other long term (current) drug therapy: Secondary | ICD-10-CM | POA: Diagnosis not present

## 2016-03-17 DIAGNOSIS — Z789 Other specified health status: Secondary | ICD-10-CM | POA: Diagnosis not present

## 2016-04-19 DIAGNOSIS — Z6836 Body mass index (BMI) 36.0-36.9, adult: Secondary | ICD-10-CM | POA: Diagnosis not present

## 2016-04-19 DIAGNOSIS — Z01419 Encounter for gynecological examination (general) (routine) without abnormal findings: Secondary | ICD-10-CM | POA: Diagnosis not present

## 2016-05-17 ENCOUNTER — Telehealth (INDEPENDENT_AMBULATORY_CARE_PROVIDER_SITE_OTHER): Payer: Self-pay | Admitting: *Deleted

## 2016-05-17 ENCOUNTER — Encounter (INDEPENDENT_AMBULATORY_CARE_PROVIDER_SITE_OTHER): Payer: Self-pay | Admitting: *Deleted

## 2016-05-17 MED ORDER — PEG 3350-KCL-NA BICARB-NACL 420 G PO SOLR: 4000.0000 mL | Freq: Once | ORAL | 0 refills | Status: AC

## 2016-05-17 NOTE — Telephone Encounter (Signed)
Referring MD/PCP: vyas   Procedure: tcs  Reason/Indication:  fam hx colon ca, hx polyps  Has patient had this procedure before?  Yes, 2012  If so, when, by whom and where?    Is there a family history of colon cancer?  Yes, aunt  Who?  What age when diagnosed?    Is patient diabetic?   no      Does patient have prosthetic heart valve or mechanical valve?  no  Do you have a pacemaker?  no  Has patient ever had endocarditis? no  Has patient had joint replacement within last 12 months?  no  Does patient tend to be constipated or take laxatives? no  Does patient have a history of alcohol/drug use?  no  Is patient on Coumadin, Plavix and/or Aspirin? no  Medications: none  Allergies: nkda  Medication Adjustment:   Procedure date & time: 06/14/16 at 730

## 2016-05-17 NOTE — Telephone Encounter (Signed)
Patient needs trilyte 

## 2016-05-17 NOTE — Telephone Encounter (Signed)
agree

## 2016-05-18 DIAGNOSIS — E2839 Other primary ovarian failure: Secondary | ICD-10-CM | POA: Diagnosis not present

## 2016-06-02 DIAGNOSIS — Z1231 Encounter for screening mammogram for malignant neoplasm of breast: Secondary | ICD-10-CM | POA: Diagnosis not present

## 2016-06-14 ENCOUNTER — Encounter (HOSPITAL_COMMUNITY)
Admission: RE | Disposition: A | Discharge status: Home / Self Care | Payer: Self-pay | Source: Ambulatory Visit | Attending: Internal Medicine

## 2016-06-14 ENCOUNTER — Encounter (HOSPITAL_COMMUNITY): Payer: Self-pay | Admitting: *Deleted

## 2016-06-14 ENCOUNTER — Ambulatory Visit (HOSPITAL_COMMUNITY)
Admission: RE | Admit: 2016-06-14 | Discharge: 2016-06-14 | Disposition: A | Discharge status: Home / Self Care | Payer: 59 | Source: Ambulatory Visit | Attending: Internal Medicine | Admitting: Internal Medicine

## 2016-06-14 DIAGNOSIS — Z860100 Personal history of colon polyps, unspecified: Secondary | ICD-10-CM | POA: Insufficient documentation

## 2016-06-14 DIAGNOSIS — K648 Other hemorrhoids: Secondary | ICD-10-CM | POA: Insufficient documentation

## 2016-06-14 DIAGNOSIS — Z79899 Other long term (current) drug therapy: Secondary | ICD-10-CM | POA: Diagnosis not present

## 2016-06-14 DIAGNOSIS — Z800 Family history of malignant neoplasm of digestive organs: Secondary | ICD-10-CM | POA: Diagnosis not present

## 2016-06-14 DIAGNOSIS — Z1211 Encounter for screening for malignant neoplasm of colon: Secondary | ICD-10-CM | POA: Insufficient documentation

## 2016-06-14 DIAGNOSIS — Z8371 Family history of colonic polyps: Secondary | ICD-10-CM | POA: Diagnosis not present

## 2016-06-14 HISTORY — PX: COLONOSCOPY: SHX5424

## 2016-06-14 HISTORY — DX: Polyp of colon: K63.5

## 2016-06-14 SURGERY — COLONOSCOPY
Anesthesia: Moderate Sedation

## 2016-06-14 MED ORDER — MEPERIDINE HCL 50 MG/ML IJ SOLN
INTRAMUSCULAR | Status: DC | PRN
  Administered 2016-06-14 (×2): 25 mg via INTRAVENOUS

## 2016-06-14 MED ORDER — MIDAZOLAM HCL 5 MG/5ML IJ SOLN
INTRAMUSCULAR | Status: DC | PRN
  Administered 2016-06-14 (×2): 2 mg via INTRAVENOUS
  Administered 2016-06-14 (×2): 1 mg via INTRAVENOUS

## 2016-06-14 MED ORDER — SODIUM CHLORIDE 0.9 % IV SOLN
INTRAVENOUS | Status: DC
  Administered 2016-06-14: 07:00:00 via INTRAVENOUS

## 2016-06-14 MED ORDER — MEPERIDINE HCL 50 MG/ML IJ SOLN
INTRAMUSCULAR | Status: AC
  Filled 2016-06-14: qty 1

## 2016-06-14 MED ORDER — MIDAZOLAM HCL 5 MG/5ML IJ SOLN
INTRAMUSCULAR | Status: AC
  Filled 2016-06-14: qty 10

## 2016-06-14 NOTE — Op Note (Signed)
[facility] Patient Name: Monica Simpson Procedure Date: 06/14/2016 7:23 AM MRN: [id number] Date of Birth: [birth year] Attending MD: Hildred Laser , MD CSN: [id number] Age: 51 Admit Type: Outpatient Procedure:                Colonoscopy Indications:              High risk colon cancer surveillance: Personal                            history of colonic polyps. Mother and brother with                            multiple colonic adenomas. Maternal aunt died of                            colon cancer at age 38. Providers:                Hildred Laser, MD, Charlyne Petrin RN, RN, Isabella Stalling, Technician Referring MD:             Glenda Chroman, MD Medicines:                Meperidine 50 mg IV, Midazolam 6 mg IV Complications:            No immediate complications. Estimated Blood Loss:     Estimated blood loss: none. Procedure:                Pre-Anesthesia Assessment:                           - Prior to the procedure, a History and Physical                            was performed, and patient medications and                            allergies were reviewed. The patient's tolerance of                            previous anesthesia was also reviewed. The risks                            and benefits of the procedure and the sedation                            options and risks were discussed with the patient.                            All questions were answered, and informed consent                            was obtained. Prior Anticoagulants: The patient  last took ibuprofen 1 day prior to the procedure.                            ASA Grade Assessment: I - A normal, healthy                            patient. After reviewing the risks and benefits,                            the patient was deemed in satisfactory condition to                            undergo the procedure.                           After obtaining  informed consent, the colonoscope                            was passed under direct vision. Throughout the                            procedure, the patient's blood pressure, pulse, and                            oxygen saturations were monitored continuously. The                            was introduced through the anus and advanced to the                            the cecum, identified by appendiceal orifice and                            ileocecal valve. The colonoscopy was performed                            without difficulty. The patient tolerated the                            procedure well. The quality of the bowel                            preparation was excellent. The ileocecal valve,                            appendiceal orifice, and rectum were photographed. Scope In: 7:48:09 AM Scope Out: 8:04:28 AM Scope Withdrawal Time: 0 hours 9 minutes 11 seconds  Total Procedure Duration: 0 hours 16 minutes 19 seconds  Findings:      The perianal and digital rectal examinations were normal.      The exam was otherwise normal throughout the examined colon.      Internal hemorrhoids were found during retroflexion. The hemorrhoids       were small. Impression:               -  Internal hemorrhoids.                           - No specimens collected. Moderate Sedation:      Moderate (conscious) sedation was administered by the endoscopy nurse       and supervised by the endoscopist. The following parameters were       monitored: oxygen saturation, heart rate, blood pressure, CO2       capnography and response to care. Total physician intraservice time was       21 minutes. Recommendation:           - Patient has a contact number available for                            emergencies. The signs and symptoms of potential                            delayed complications were discussed with the                            patient. Return to normal activities tomorrow.                             Written discharge instructions were provided to the                            patient.                           - Resume previous diet today.                           - Continue present medications.                           - Repeat colonoscopy in 5 years for surveillance. Procedure Code(s):        --- Professional ---                           [id number], Colonoscopy, flexible; diagnostic, including                            collection of specimen(s) by brushing or washing,                            when performed (separate procedure)                           99152, Moderate sedation services provided by the                            same physician or other qualified health care                            professional performing the diagnostic or  therapeutic service that the sedation supports,                            requiring the presence of an independent trained                            observer to assist in the monitoring of the                            patient's level of consciousness and physiological                            status; initial 15 minutes of intraservice time,                            patient age 74 years or older Diagnosis Code(s):        --- Professional ---                           Z86.010, Personal history of colonic polyps                           K64.8, Other hemorrhoids CPT copyright 2016 [employer]. All rights reserved. The codes documented in this report are preliminary and upon coder review may  be revised to meet current compliance requirements. Hildred Laser, MD Hildred Laser, MD 06/14/2016 8:11:11 AM This report has been signed electronically. Number of Addenda: 0

## 2016-06-14 NOTE — H&P (Signed)
Monica Simpson is an 51 y.o. female.   Chief Complaint: Patient is here for colonoscopy. HPI: Patient is 51 year old Caucasian female was history of colonic adenoma and is here for surveillance colonoscopy. Last exam was in September 2012. Family history significant for CRC maternal aunt at age 42 and died 49 years later. Mother has had multiple colonic adenomas and so has a brother. She denies abdominal pain change in bowel habits or rectal bleeding.  Past Medical History:  Diagnosis Date  . Colon polyps     Past Surgical History:  Procedure Laterality Date  . ABDOMINAL HYSTERECTOMY    . ANTERIOR AND POSTERIOR VAGINAL REPAIR    . BLADDER SUSPENSION    . COLONOSCOPY  02/23/2011   Procedure: COLONOSCOPY;  Surgeon: Rogene Houston, MD;  Location: [facility] ENDO SUITE;  Service: Endoscopy;  Laterality: N/A;  . DILATION AND CURETTAGE OF UTERUS    . TONSILLECTOMY    . WISDOM TOOTH EXTRACTION      Family History  Problem Relation Age of Onset  . Colon cancer Other    Social History:  reports that she has never smoked. She has never used smokeless tobacco. She reports that she does not drink alcohol or use drugs.  Allergies:  Allergies  Allergen Reactions  . Morphine And Related Nausea And Vomiting    Medications Prior to Admission  Medication Sig Dispense Refill  . Calcium Carbonate-Vitamin D (CALCIUM 500 + D PO) Take 1 tablet by mouth 2 (two) times daily.    Marland Kitchen ibuprofen (ADVIL,MOTRIN) 200 MG tablet Take 400 mg by mouth every 6 (six) hours as needed for headache or moderate pain.    . Multiple Vitamin (MULTIVITAMIN WITH MINERALS) TABS tablet Take 1 tablet by mouth daily.    . Menthol (ICY HOT) 5 % PTCH Apply 1 patch topically daily as needed (muscle pain).      No results found for this or any previous visit (from the past 48 hour(s)). No results found.  ROS  Blood pressure 103/65, pulse 62, temperature 98.2 F (36.8 C), temperature source Oral, resp. rate 17, height 5\' 6"  (1.676  m), weight 220 lb (99.8 kg), SpO2 100 %. Physical Exam  Constitutional: She appears well-developed and well-nourished.  Eyes: Conjunctivae are normal. No scleral icterus.  Neck: No thyromegaly present.  Cardiovascular: Normal rate, regular rhythm and normal heart sounds.   No murmur heard. Respiratory: Effort normal and breath sounds normal.  GI: Soft. She exhibits no distension and no mass. There is no tenderness.  Musculoskeletal: She exhibits no edema.  Lymphadenopathy:    She has no cervical adenopathy.  Neurological: She is alert.  Skin: Skin is warm and dry.     Assessment/Plan History of colonic adenomas. Family history of colonic adenoma in 2 first-degree relatives and CRC in second-degree relative young age. Surveillance colonoscopy.  Hildred Laser, MD 06/14/2016, 7:39 AM

## 2016-06-14 NOTE — Discharge Instructions (Signed)
Resume usual medications and diet. °No driving for 24 hours. °Next colonoscopy in 5 years. ° ° ° ° ° ° °Colonoscopy, Adult, Care After °This sheet gives you information about how to care for yourself after your procedure. Your doctor may also give you more specific instructions. If you have problems or questions, call your doctor. °Follow these instructions at home: °General instructions ° °· For the first 24 hours after the procedure: °? Do not drive or use machinery. °? Do not sign important documents. °? Do not drink alcohol. °? Do your daily activities more slowly than normal. °? Eat foods that are soft and easy to digest. °? Rest often. °· Take over-the-counter or prescription medicines only as told by your doctor. °· It is up to you to get the results of your procedure. Ask your doctor, or the department performing the procedure, when your results will be ready. °To help cramping and bloating: °· Try walking around. °· Put heat on your belly (abdomen) as told by your doctor. Use a heat source that your doctor recommends, such as a moist heat pack or a heating pad. °? Put a towel between your skin and the heat source. °? Leave the heat on for 20-30 minutes. °? Remove the heat if your skin turns bright red. This is especially important if you cannot feel pain, heat, or cold. You can get burned. °Eating and drinking °· Drink enough fluid to keep your pee (urine) clear or pale yellow. °· Return to your normal diet as told by your doctor. Avoid heavy or fried foods that are hard to digest. °· Avoid drinking alcohol for as long as told by your doctor. °Contact a doctor if: °· You have blood in your poop (stool) 2-3 days after the procedure. °Get help right away if: °· You have more than a small amount of blood in your poop. °· You see large clumps of tissue (blood clots) in your poop. °· Your belly is swollen. °· You feel sick to your stomach (nauseous). °· You throw up (vomit). °· You have a fever. °· You have  belly pain that gets worse, and medicine does not help your pain. °This information is not intended to replace advice given to you by your health care provider. Make sure you discuss any questions you have with your health care provider. °Document Released: 07/01/2010 Document Revised: 02/21/2016 Document Reviewed: 02/21/2016 °Elsevier Interactive Patient Education © 2017 [employer] ° °

## 2016-06-15 ENCOUNTER — Encounter (HOSPITAL_COMMUNITY): Payer: Self-pay | Admitting: Internal Medicine

## 2016-07-04 MED FILL — AMOXICILLIN 500 MG CAPSULE: 500 | 8 days supply | Qty: 28 | Fill #0

## 2016-12-12 DIAGNOSIS — L814 Other melanin hyperpigmentation: Secondary | ICD-10-CM | POA: Diagnosis not present

## 2016-12-12 DIAGNOSIS — L820 Inflamed seborrheic keratosis: Secondary | ICD-10-CM | POA: Diagnosis not present

## 2016-12-12 DIAGNOSIS — Z85828 Personal history of other malignant neoplasm of skin: Secondary | ICD-10-CM | POA: Diagnosis not present

## 2017-05-14 DIAGNOSIS — Z299 Encounter for prophylactic measures, unspecified: Secondary | ICD-10-CM | POA: Diagnosis not present

## 2017-05-14 DIAGNOSIS — Z2821 Immunization not carried out because of patient refusal: Secondary | ICD-10-CM | POA: Diagnosis not present

## 2017-05-14 DIAGNOSIS — Z789 Other specified health status: Secondary | ICD-10-CM | POA: Diagnosis not present

## 2017-05-14 DIAGNOSIS — Z79899 Other long term (current) drug therapy: Secondary | ICD-10-CM | POA: Diagnosis not present

## 2017-05-14 DIAGNOSIS — Z6834 Body mass index (BMI) 34.0-34.9, adult: Secondary | ICD-10-CM | POA: Diagnosis not present

## 2017-05-14 DIAGNOSIS — R5383 Other fatigue: Secondary | ICD-10-CM | POA: Diagnosis not present

## 2017-05-14 DIAGNOSIS — Z1331 Encounter for screening for depression: Secondary | ICD-10-CM | POA: Diagnosis not present

## 2017-05-14 DIAGNOSIS — Z1211 Encounter for screening for malignant neoplasm of colon: Secondary | ICD-10-CM | POA: Diagnosis not present

## 2017-05-14 DIAGNOSIS — Z0000 Encounter for general adult medical examination without abnormal findings: Secondary | ICD-10-CM | POA: Diagnosis not present

## 2017-07-25 DIAGNOSIS — Z01419 Encounter for gynecological examination (general) (routine) without abnormal findings: Secondary | ICD-10-CM | POA: Diagnosis not present

## 2017-07-25 DIAGNOSIS — Z6835 Body mass index (BMI) 35.0-35.9, adult: Secondary | ICD-10-CM | POA: Diagnosis not present

## 2017-07-25 DIAGNOSIS — Z1231 Encounter for screening mammogram for malignant neoplasm of breast: Secondary | ICD-10-CM | POA: Diagnosis not present

## 2017-07-27 ENCOUNTER — Other Ambulatory Visit: Payer: Self-pay | Admitting: Obstetrics and Gynecology

## 2017-07-27 DIAGNOSIS — R928 Other abnormal and inconclusive findings on diagnostic imaging of breast: Secondary | ICD-10-CM

## 2017-08-03 ENCOUNTER — Other Ambulatory Visit: Payer: 59

## 2017-08-10 ENCOUNTER — Ambulatory Visit
Admission: RE | Admit: 2017-08-10 | Discharge: 2017-08-10 | Disposition: A | Discharge status: Home / Self Care | Payer: 59 | Source: Ambulatory Visit | Attending: Obstetrics and Gynecology | Admitting: Obstetrics and Gynecology

## 2017-08-10 DIAGNOSIS — R928 Other abnormal and inconclusive findings on diagnostic imaging of breast: Secondary | ICD-10-CM | POA: Diagnosis not present

## 2017-08-10 DIAGNOSIS — N644 Mastodynia: Secondary | ICD-10-CM | POA: Diagnosis not present

## 2018-05-28 DIAGNOSIS — Z6837 Body mass index (BMI) 37.0-37.9, adult: Secondary | ICD-10-CM | POA: Diagnosis not present

## 2018-05-28 DIAGNOSIS — Z0000 Encounter for general adult medical examination without abnormal findings: Secondary | ICD-10-CM | POA: Diagnosis not present

## 2018-05-28 DIAGNOSIS — Z1211 Encounter for screening for malignant neoplasm of colon: Secondary | ICD-10-CM | POA: Diagnosis not present

## 2018-05-28 DIAGNOSIS — Z79899 Other long term (current) drug therapy: Secondary | ICD-10-CM | POA: Diagnosis not present

## 2018-05-28 DIAGNOSIS — Z299 Encounter for prophylactic measures, unspecified: Secondary | ICD-10-CM | POA: Diagnosis not present

## 2018-05-28 DIAGNOSIS — Z1331 Encounter for screening for depression: Secondary | ICD-10-CM | POA: Diagnosis not present

## 2018-05-28 DIAGNOSIS — R5383 Other fatigue: Secondary | ICD-10-CM | POA: Diagnosis not present

## 2018-08-12 DIAGNOSIS — Z6836 Body mass index (BMI) 36.0-36.9, adult: Secondary | ICD-10-CM | POA: Diagnosis not present

## 2018-08-12 DIAGNOSIS — Z1231 Encounter for screening mammogram for malignant neoplasm of breast: Secondary | ICD-10-CM | POA: Diagnosis not present

## 2018-08-12 DIAGNOSIS — Z01419 Encounter for gynecological examination (general) (routine) without abnormal findings: Secondary | ICD-10-CM | POA: Diagnosis not present

## 2018-10-02 IMAGING — US ULTRASOUND LEFT BREAST LIMITED
1 series · 10 of 10 positions shown · non-contrast
Comparison: July 25, 2017 and multiple priors dating back to
May 12, 2009

CLINICAL DATA: 51-year-old patient recalled from screening
mammogram for evaluation of possible asymmetry in the far outer left
breast.

EXAM:
DIGITAL DIAGNOSTIC LEFT MAMMOGRAM WITH CAD AND TOMO
ULTRASOUND LEFT BREAST

[Series 1: ultrasound left breast limited · 0.06mm/px · 10 of 10 slices shown]
[im 1/10]
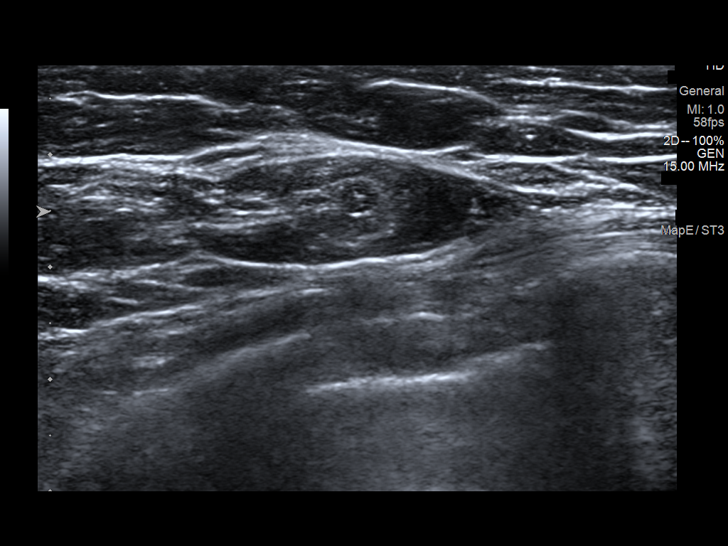
[im 2/10]
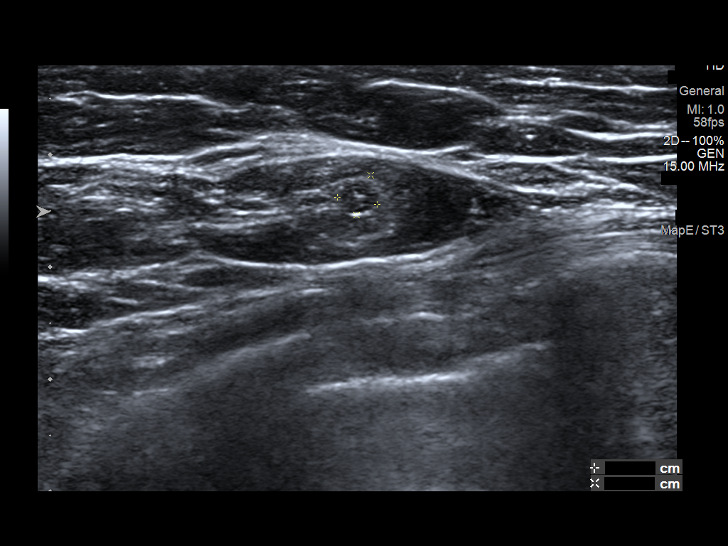
[im 3/10]
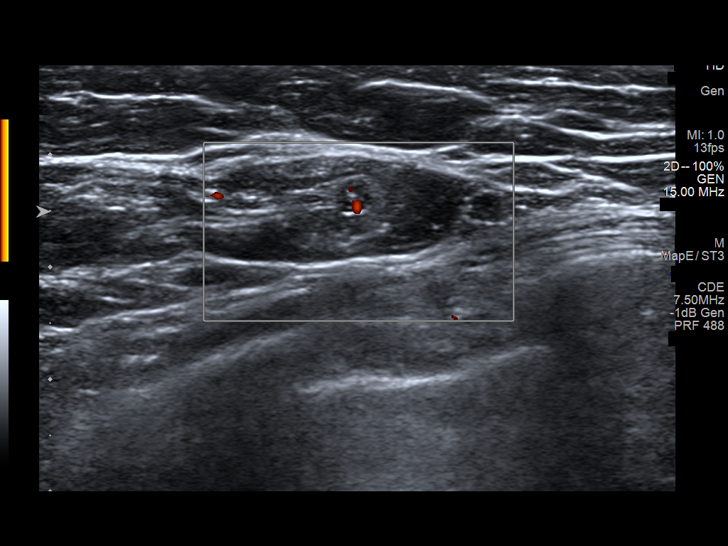
[im 4/10]
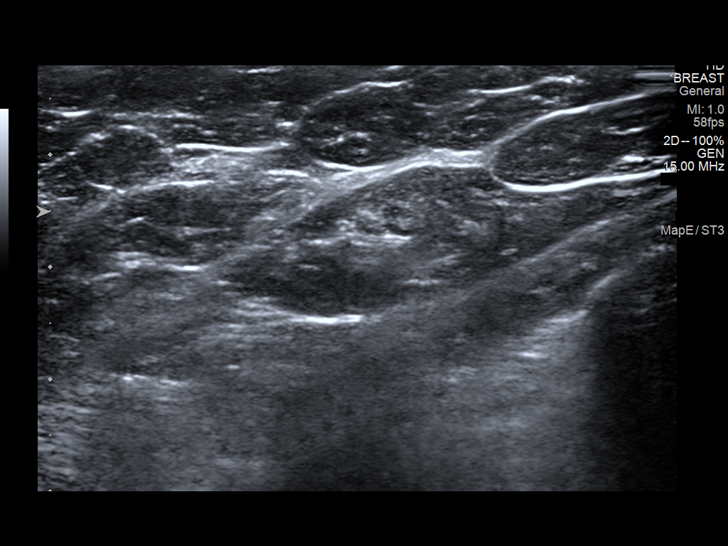
[im 5/10]
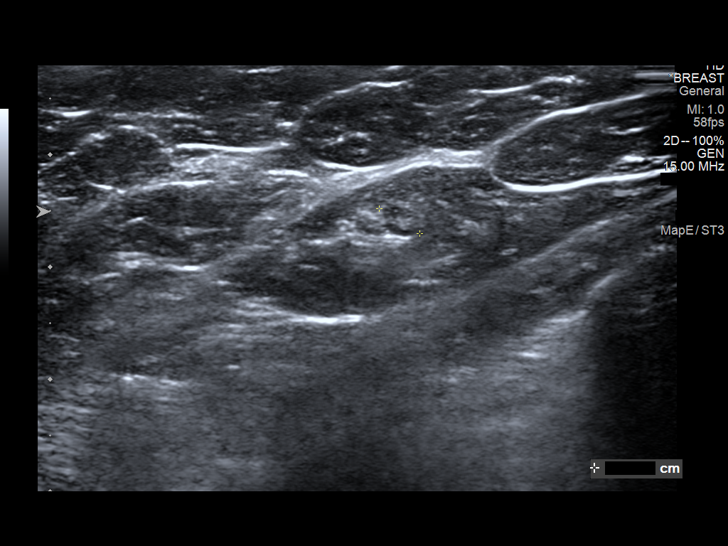
[im 6/10]
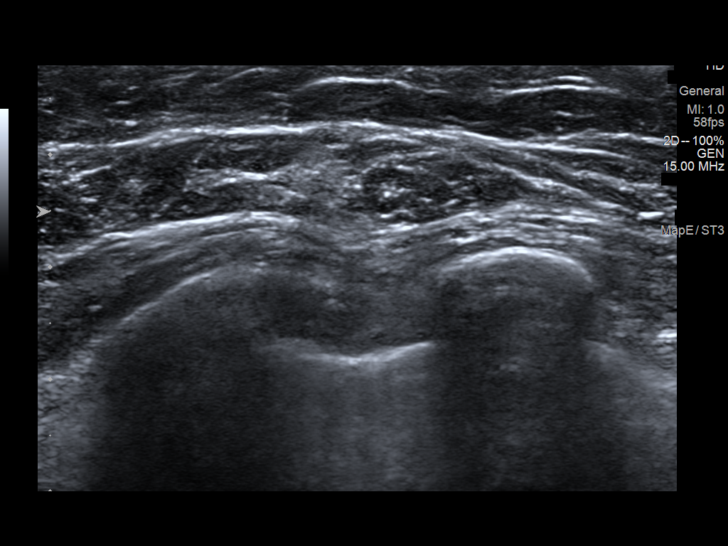
[im 7/10]
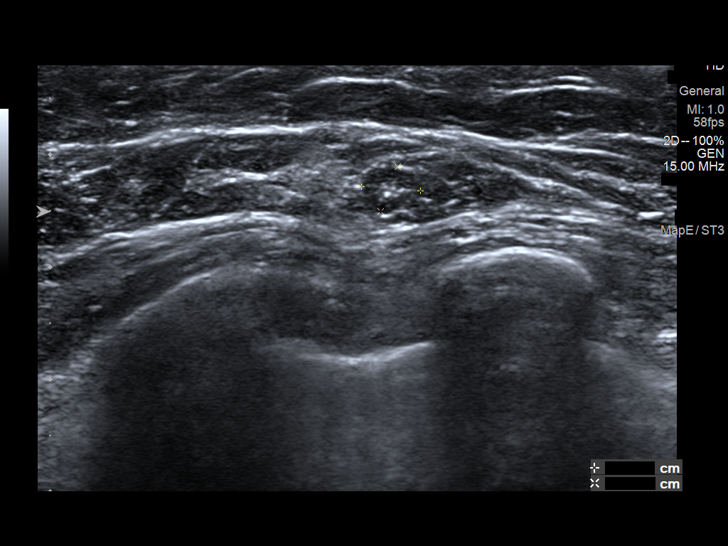
[im 8/10]
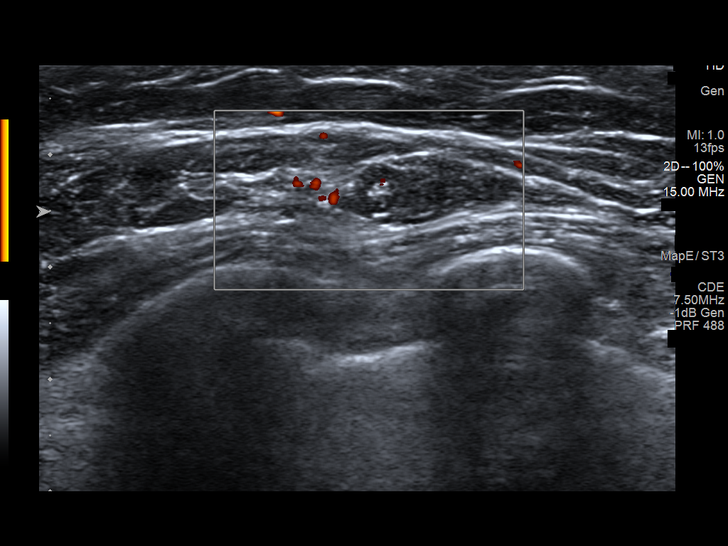
[im 9/10]
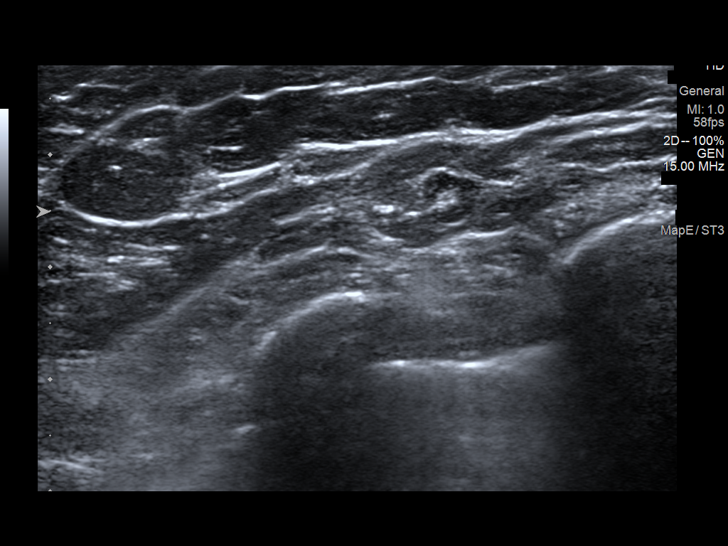
[im 10/10]
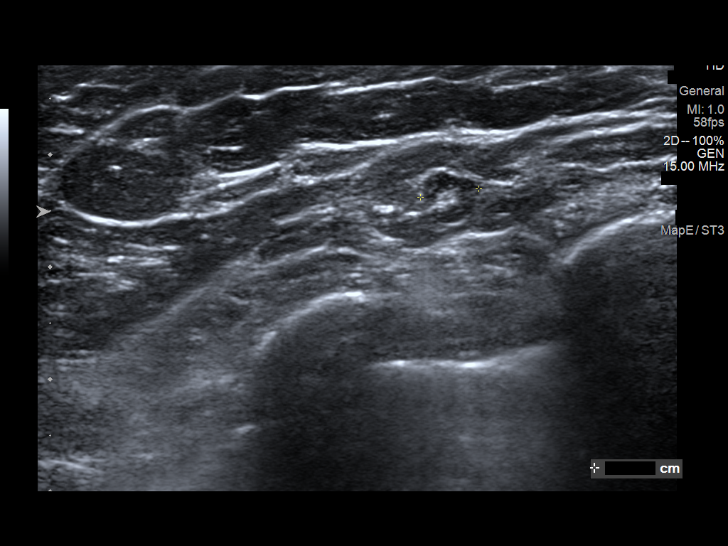

[10 of 10 positions shown; findings below may reference images not displayed]

ACR Breast Density Category b: There are scattered areas of
fibroglandular density.
FINDINGS: Focal spot compression views of the posterior third of the outer
left breast confirm approximately 5 mm circumscribed oval mass with
probable central fat density. This is most likely benign
intramammary lymph node.

Mammographic images were processed with CAD.

Targeted ultrasound is performed, showing 2 benign intramammary
lymph nodes in the far outer left breast. The larger of these 2 is
felt to account for the asymmetry seen on the recent mammogram and
is at [DATE] position 13 cm from nipple measuring 0.5 x 0.4 0.5 cm. A
second smaller intramammary lymph node is identified at [DATE] to 3
o'clock position 12 cm from the nipple measuring 0.4 cm greatest
diameter.
IMPRESSION: Benign intramammary lymph node accounts for the mass seen on the
mammogram. No evidence of malignancy in the left breast.

RECOMMENDATION:
Screening mammogram in one year.(Code:VS-0-IH5)

I have discussed the findings and recommendations with the patient.
Results were also provided in writing at the conclusion of the
visit. If applicable, a reminder letter will be sent to the patient
regarding the next appointment.

BI-RADS CATEGORY  2: Benign.

## 2018-10-02 IMAGING — MG DIGITAL DIAGNOSTIC UNILATERAL LEFT MAMMOGRAM WITH TOMO AND CAD
4 series · 4 of 12 positions shown · non-contrast
Comparison: July 25, 2017 and multiple priors dating back to
May 12, 2009

CLINICAL DATA: 51-year-old patient recalled from screening
mammogram for evaluation of possible asymmetry in the far outer left
breast.

EXAM:
DIGITAL DIAGNOSTIC LEFT MAMMOGRAM WITH CAD AND TOMO
ULTRASOUND LEFT BREAST

[L CC synth-2D]
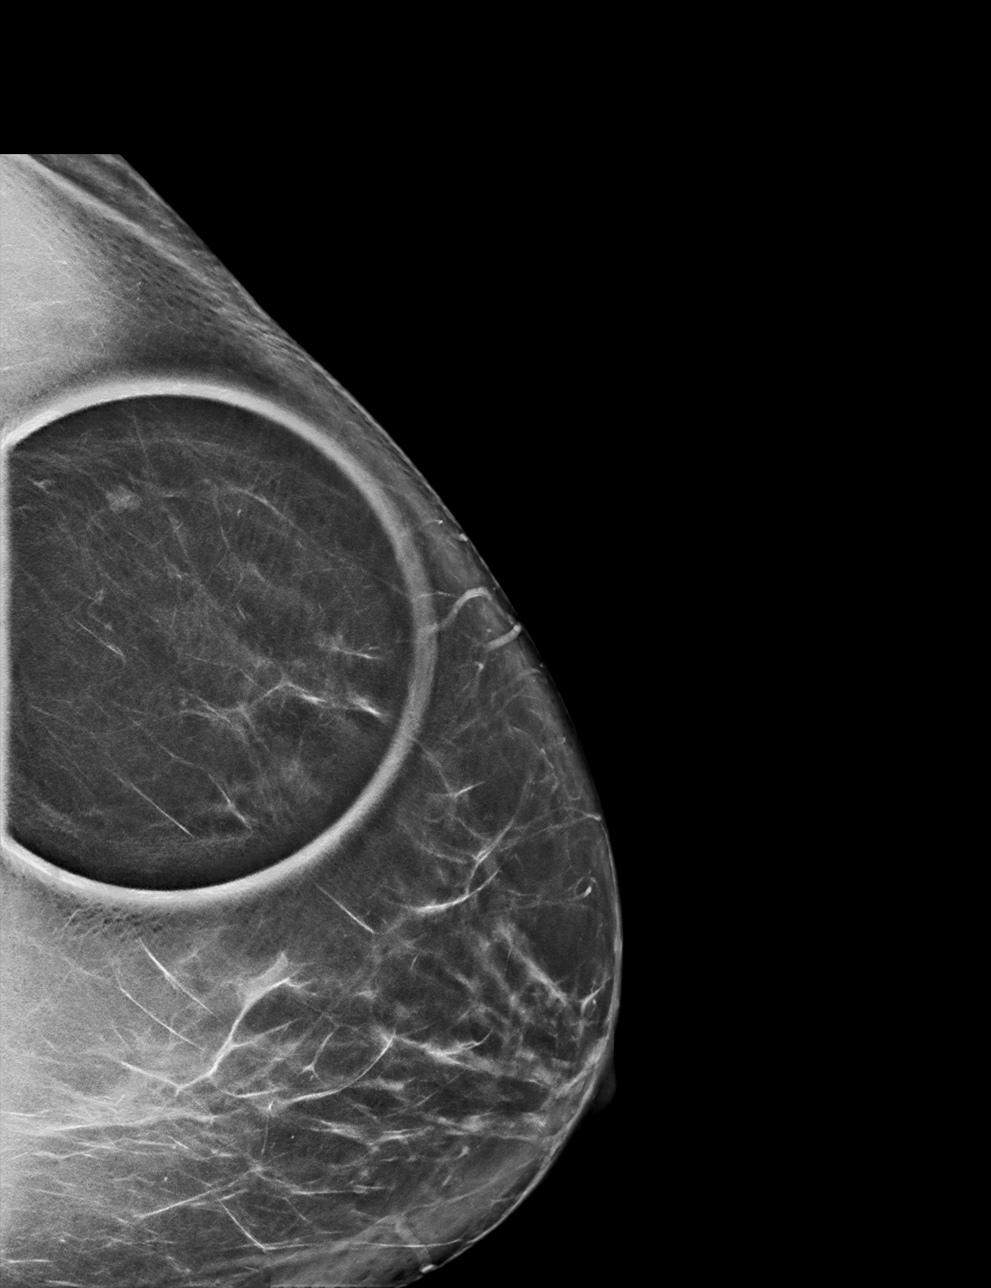

[L MLO synth-2D]
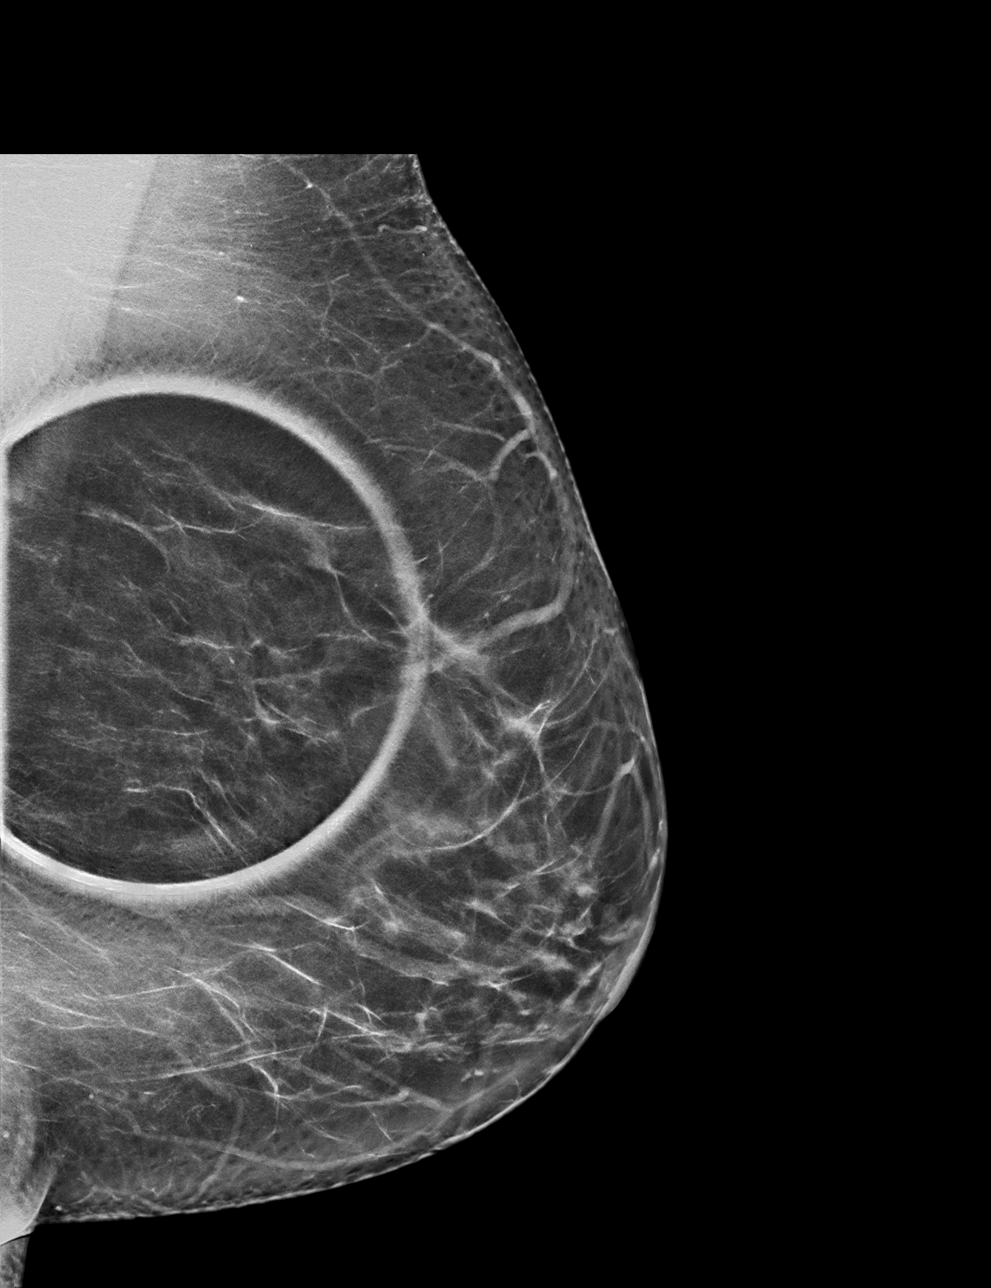

[L MLO tomo · tomo slice 39/77.0]
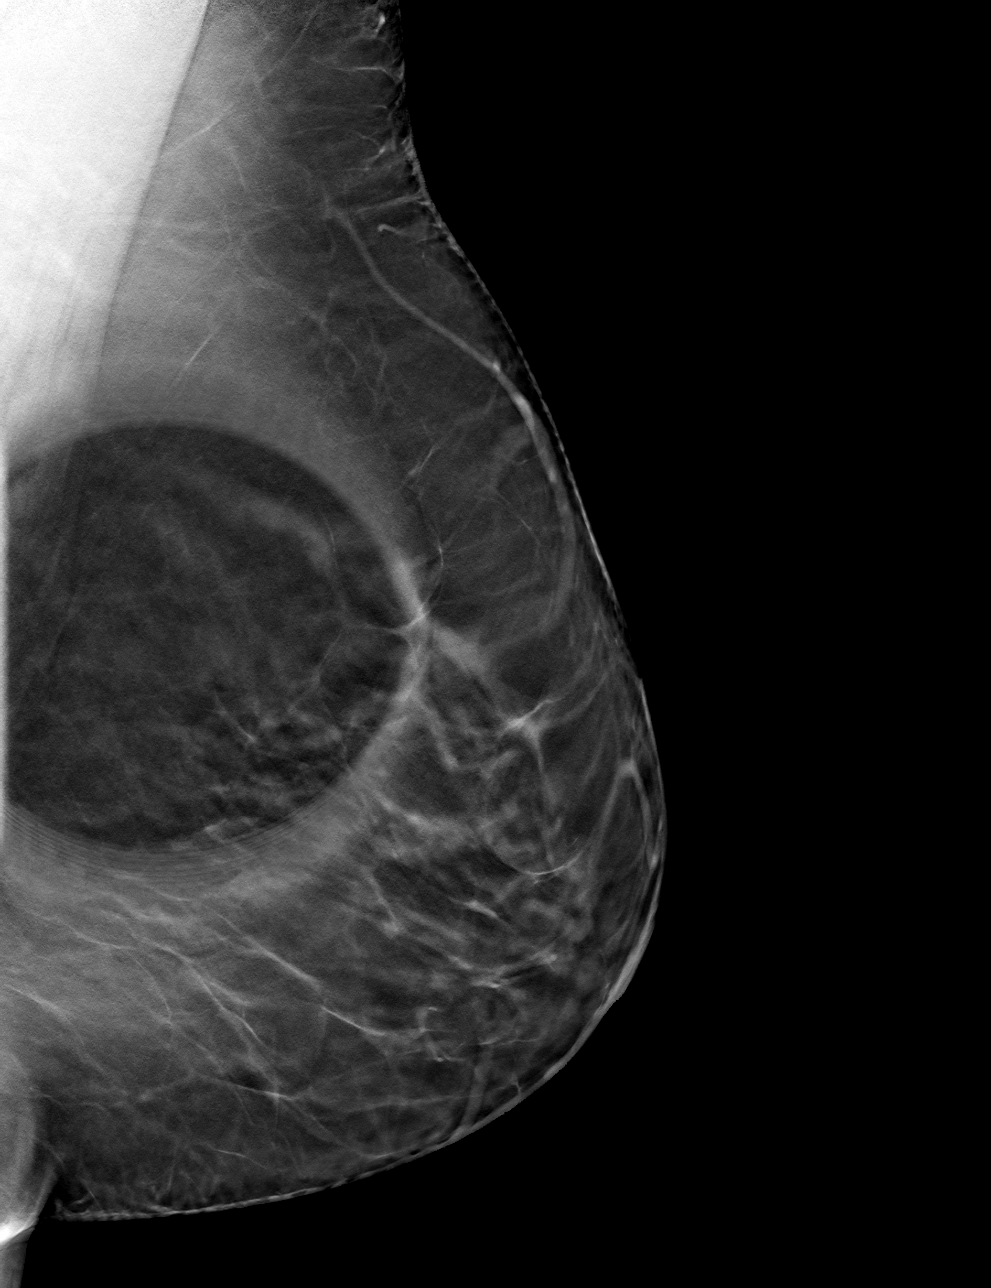

[L CC tomo · tomo slice 43/84.0]
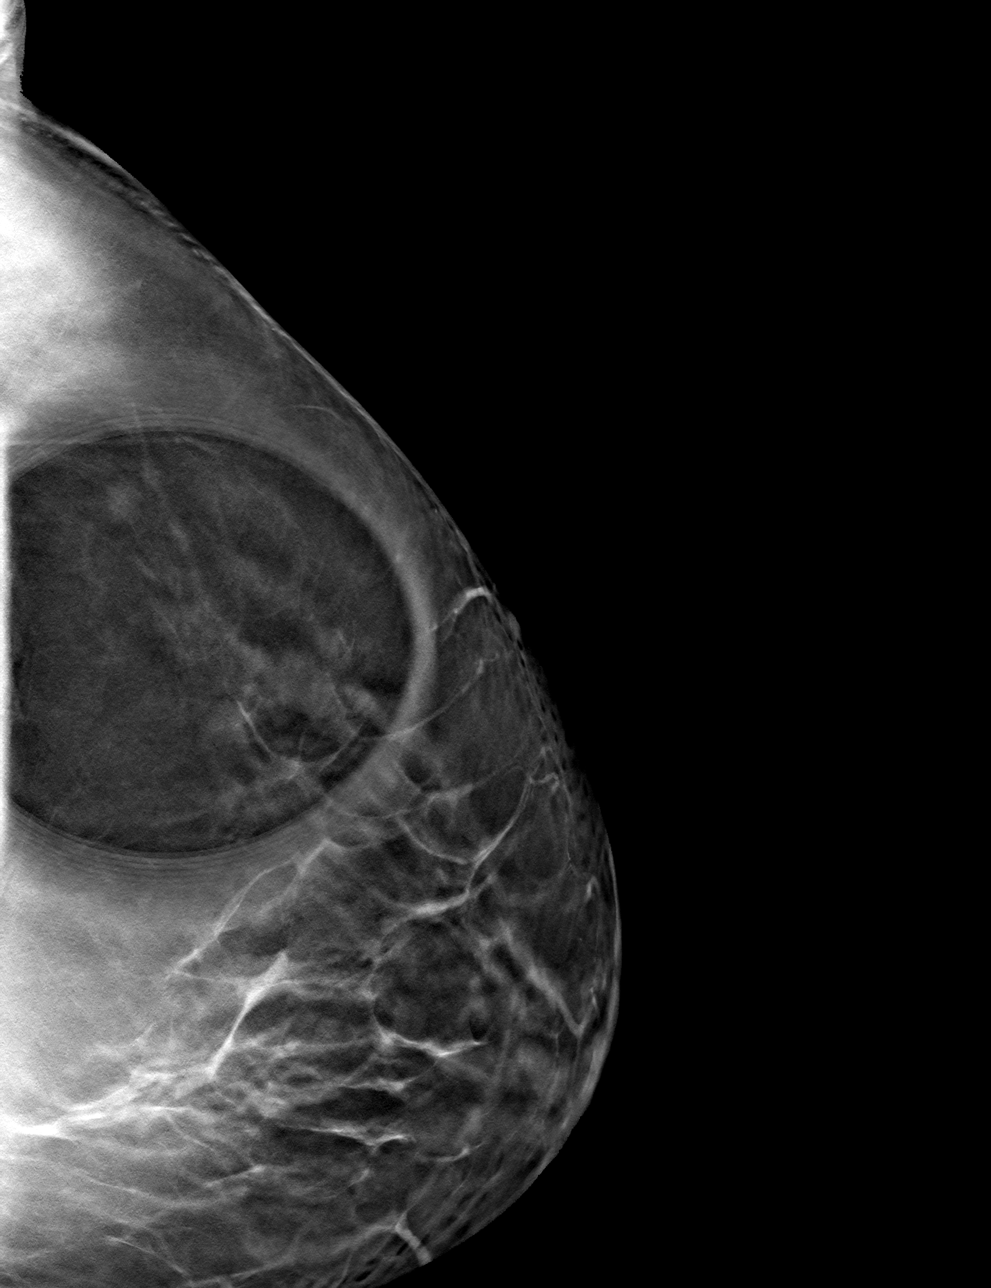

[4 of 12 positions shown; findings below may reference images not displayed]

ACR Breast Density Category b: There are scattered areas of
fibroglandular density.
FINDINGS: Focal spot compression views of the posterior third of the outer
left breast confirm approximately 5 mm circumscribed oval mass with
probable central fat density. This is most likely benign
intramammary lymph node.

Mammographic images were processed with CAD.

Targeted ultrasound is performed, showing 2 benign intramammary
lymph nodes in the far outer left breast. The larger of these 2 is
felt to account for the asymmetry seen on the recent mammogram and
is at [DATE] position 13 cm from nipple measuring 0.5 x 0.4 0.5 cm. A
second smaller intramammary lymph node is identified at [DATE] to 3
o'clock position 12 cm from the nipple measuring 0.4 cm greatest
diameter.
IMPRESSION: Benign intramammary lymph node accounts for the mass seen on the
mammogram. No evidence of malignancy in the left breast.

RECOMMENDATION:
Screening mammogram in one year.(Code:VS-0-IH5)

I have discussed the findings and recommendations with the patient.
Results were also provided in writing at the conclusion of the
visit. If applicable, a reminder letter will be sent to the patient
regarding the next appointment.

BI-RADS CATEGORY  2: Benign.

## 2019-06-24 DIAGNOSIS — E559 Vitamin D deficiency, unspecified: Secondary | ICD-10-CM | POA: Diagnosis not present

## 2019-06-24 DIAGNOSIS — Z6828 Body mass index (BMI) 28.0-28.9, adult: Secondary | ICD-10-CM | POA: Diagnosis not present

## 2019-06-24 DIAGNOSIS — Z1331 Encounter for screening for depression: Secondary | ICD-10-CM | POA: Diagnosis not present

## 2019-06-24 DIAGNOSIS — R5383 Other fatigue: Secondary | ICD-10-CM | POA: Diagnosis not present

## 2019-06-24 DIAGNOSIS — Z299 Encounter for prophylactic measures, unspecified: Secondary | ICD-10-CM | POA: Diagnosis not present

## 2019-06-24 DIAGNOSIS — Z0000 Encounter for general adult medical examination without abnormal findings: Secondary | ICD-10-CM | POA: Diagnosis not present

## 2019-06-24 DIAGNOSIS — Z1211 Encounter for screening for malignant neoplasm of colon: Secondary | ICD-10-CM | POA: Diagnosis not present

## 2019-06-26 MED FILL — VIT D2 1.25 MG (50,000 UNIT: 1.25 MG | 87 days supply | Qty: 25 | Fill #0

## 2019-08-13 DIAGNOSIS — H40013 Open angle with borderline findings, low risk, bilateral: Secondary | ICD-10-CM | POA: Diagnosis not present

## 2019-08-13 DIAGNOSIS — H524 Presbyopia: Secondary | ICD-10-CM | POA: Diagnosis not present

## 2019-08-28 DIAGNOSIS — Z1231 Encounter for screening mammogram for malignant neoplasm of breast: Secondary | ICD-10-CM | POA: Diagnosis not present

## 2019-08-28 DIAGNOSIS — Z6827 Body mass index (BMI) 27.0-27.9, adult: Secondary | ICD-10-CM | POA: Diagnosis not present

## 2019-08-28 DIAGNOSIS — Z01419 Encounter for gynecological examination (general) (routine) without abnormal findings: Secondary | ICD-10-CM | POA: Diagnosis not present

## 2019-08-28 MED FILL — PREMARIN VAGINAL CREAM-APPL: 0.625 | 90 days supply | Qty: 30 | Fill #0

## 2019-09-01 MED FILL — PREMARIN VAGINAL CREAM-APPL: 0.625 | 90 days supply | Qty: 30 | Fill #0

## 2019-09-22 DIAGNOSIS — Z13820 Encounter for screening for osteoporosis: Secondary | ICD-10-CM | POA: Diagnosis not present

## 2019-10-03 ENCOUNTER — Other Ambulatory Visit (HOSPITAL_COMMUNITY): Payer: Self-pay | Admitting: Family Medicine

## 2019-10-03 DIAGNOSIS — Z299 Encounter for prophylactic measures, unspecified: Secondary | ICD-10-CM | POA: Diagnosis not present

## 2019-10-03 DIAGNOSIS — L309 Dermatitis, unspecified: Secondary | ICD-10-CM | POA: Diagnosis not present

## 2019-10-03 DIAGNOSIS — Z789 Other specified health status: Secondary | ICD-10-CM | POA: Diagnosis not present

## 2019-10-03 MED FILL — predniSONE 5 MG (21) TBPK: 5 | 6 days supply | Qty: 21 | Fill #0

## 2019-10-03 MED FILL — VIT D2 1.25 MG (50,000 UNIT: 1.25 MG | 90 days supply | Qty: 25 | Fill #0

## 2019-10-03 MED FILL — TRIAMCINOLONE 0.1% OINTMENT: 0.1 | 25 days supply | Qty: 454 | Fill #0

## 2020-03-15 MED FILL — PREMARIN VAGINAL CREAM-APPL: 0.625 | 90 days supply | Qty: 30 | Fill #2

## 2020-05-07 MED FILL — VIT D2 1.25 MG (50,000 UNIT: 1.25 MG | 90 days supply | Qty: 25 | Fill #1

## 2020-07-08 DIAGNOSIS — E559 Vitamin D deficiency, unspecified: Secondary | ICD-10-CM | POA: Diagnosis not present

## 2020-07-08 DIAGNOSIS — Z0000 Encounter for general adult medical examination without abnormal findings: Secondary | ICD-10-CM | POA: Diagnosis not present

## 2020-07-08 DIAGNOSIS — Z79899 Other long term (current) drug therapy: Secondary | ICD-10-CM | POA: Diagnosis not present

## 2020-07-08 DIAGNOSIS — Z299 Encounter for prophylactic measures, unspecified: Secondary | ICD-10-CM | POA: Diagnosis not present

## 2020-07-08 DIAGNOSIS — R5383 Other fatigue: Secondary | ICD-10-CM | POA: Diagnosis not present

## 2020-07-08 DIAGNOSIS — Z1331 Encounter for screening for depression: Secondary | ICD-10-CM | POA: Diagnosis not present

## 2020-07-08 DIAGNOSIS — Z6829 Body mass index (BMI) 29.0-29.9, adult: Secondary | ICD-10-CM | POA: Diagnosis not present

## 2020-10-07 ENCOUNTER — Other Ambulatory Visit (HOSPITAL_COMMUNITY): Payer: Self-pay

## 2020-10-07 DIAGNOSIS — Z6829 Body mass index (BMI) 29.0-29.9, adult: Secondary | ICD-10-CM | POA: Diagnosis not present

## 2020-10-07 DIAGNOSIS — Z01419 Encounter for gynecological examination (general) (routine) without abnormal findings: Secondary | ICD-10-CM | POA: Diagnosis not present

## 2020-10-07 DIAGNOSIS — Z1231 Encounter for screening mammogram for malignant neoplasm of breast: Secondary | ICD-10-CM | POA: Diagnosis not present

## 2020-10-07 MED ORDER — ESTRADIOL 0.1 MG/GM VA CREA
TOPICAL_CREAM | VAGINAL | 12 refills | Status: AC
  Filled 2020-10-07: qty 42.5, 90d supply, fill #0
  Filled 2021-07-01: qty 42.5, 90d supply, fill #1

## 2020-10-11 ENCOUNTER — Other Ambulatory Visit (HOSPITAL_COMMUNITY): Payer: Self-pay

## 2021-01-05 DIAGNOSIS — H524 Presbyopia: Secondary | ICD-10-CM | POA: Diagnosis not present

## 2021-01-05 DIAGNOSIS — H40013 Open angle with borderline findings, low risk, bilateral: Secondary | ICD-10-CM | POA: Diagnosis not present

## 2021-01-05 DIAGNOSIS — H5203 Hypermetropia, bilateral: Secondary | ICD-10-CM | POA: Diagnosis not present

## 2021-01-05 DIAGNOSIS — H52223 Regular astigmatism, bilateral: Secondary | ICD-10-CM | POA: Diagnosis not present

## 2021-05-08 ENCOUNTER — Ambulatory Visit
Admission: EM | Admit: 2021-05-08 | Discharge: 2021-05-08 | Disposition: A | Discharge status: Home / Self Care | Payer: 59 | Attending: Family Medicine | Admitting: Family Medicine

## 2021-05-08 ENCOUNTER — Other Ambulatory Visit: Payer: Self-pay

## 2021-05-08 DIAGNOSIS — B349 Viral infection, unspecified: Secondary | ICD-10-CM

## 2021-05-08 DIAGNOSIS — Z20828 Contact with and (suspected) exposure to other viral communicable diseases: Secondary | ICD-10-CM | POA: Diagnosis not present

## 2021-05-08 MED ORDER — BENZONATATE 100 MG PO CAPS: 200.0000 mg | ORAL_CAPSULE | Freq: Three times a day (TID) | ORAL | 0 refills | Status: AC | PRN

## 2021-05-08 MED ORDER — OSELTAMIVIR PHOSPHATE 75 MG PO CAPS: 75.0000 mg | ORAL_CAPSULE | Freq: Two times a day (BID) | ORAL | 0 refills | Status: DC

## 2021-05-08 NOTE — ED Provider Notes (Signed)
Roderic Palau    CSN: [id number] Arrival date & time: 05/08/21  7494      History   Chief Complaint No chief complaint on file.   HPI Monica Simpson is a 55 y.o. female.   HPI Patient presents today with 3 days of dry cough which now she reports is more of a wet cough which developed yesterday, body aches, throat aches along with generalized body aches.  No known exposure to any recent sick contacts.  She has been taking over-the-counter medication such as decongestion and NyQuil without relief of symptoms.  She is afebrile.  Past Medical History:  Diagnosis Date   Colon polyps     Patient Active Problem List   Diagnosis Date Noted   Family history of colon cancer 01/20/2016   History of colonic polyps 01/20/2016    Past Surgical History:  Procedure Laterality Date   ABDOMINAL HYSTERECTOMY     ANTERIOR AND POSTERIOR VAGINAL REPAIR     BLADDER SUSPENSION     COLONOSCOPY  02/23/2011   Procedure: COLONOSCOPY;  Surgeon: Rogene Houston, MD;  Location: [facility];  Service: Endoscopy;  Laterality: N/A;   COLONOSCOPY N/A 06/14/2016   Procedure: COLONOSCOPY;  Surgeon: Rogene Houston, MD;  Location: [facility];  Service: Endoscopy;  Laterality: N/A;  930 - moved to 1/3 @ 7:30 - Ann notified pt   DILATION AND CURETTAGE OF UTERUS     TONSILLECTOMY     WISDOM TOOTH EXTRACTION      OB History   No obstetric history on file.      Home Medications    Prior to Admission medications   Medication Sig Start Date End Date Taking? Authorizing Provider  benzonatate (TESSALON) 100 MG capsule Take 2 capsules (200 mg total) by mouth 3 (three) times daily as needed for cough. 05/08/21  Yes Scot Jun, FNP  oseltamivir (TAMIFLU) 75 MG capsule Take 1 capsule (75 mg total) by mouth 2 (two) times daily. 05/08/21  Yes Scot Jun, FNP  Calcium Carbonate-Vitamin D (CALCIUM 500 + D PO) Take 1 tablet by mouth 2 (two) times daily.    [provider]   estradiol (ESTRACE) 0.1 MG/GM vaginal cream Insert 1/2 gram vaginally twice a week at bedtime. 10/07/20     ibuprofen (ADVIL,MOTRIN) 200 MG tablet Take 400 mg by mouth every 6 (six) hours as needed for headache or moderate pain.    [provider]  Menthol (ICY HOT) 5 % PTCH Apply 1 patch topically daily as needed (muscle pain).    [provider]  Multiple Vitamin (MULTIVITAMIN WITH MINERALS) TABS tablet Take 1 tablet by mouth daily.    [provider]    Family History Family History  Problem Relation Age of Onset   Colon cancer Other    Breast cancer Mother 19    Social History Social History   Tobacco Use   Smoking status: Never   Smokeless tobacco: Never  Vaping Use   Vaping Use: Never used  Substance Use Topics   Alcohol use: No   Drug use: No     Allergies   Morphine and related   Review of Systems Review of Systems Pertinent negatives listed in HPI   Physical Exam Triage Vital Signs ED Triage Vitals  Enc Vitals Group     BP 05/08/21 0941 114/75     Pulse Rate 05/08/21 0941 (!) 101     Resp 05/08/21 0941 16  Temp 05/08/21 0941 98.9 F (37.2 C)     Temp Source 05/08/21 0941 Tympanic     SpO2 05/08/21 0941 96 %     Weight --      Height --      Head Circumference --      Peak Flow --      Pain Score 05/08/21 0938 4     Pain Loc --      Pain Edu? --      Excl. in [place]? --    No data found.  Updated Vital Signs BP 114/75 (BP Location: Right Arm)   Pulse (!) 101   Temp 98.9 F (37.2 C) (Tympanic)   Resp 16   SpO2 96%   Visual Acuity Right Eye Distance:   Left Eye Distance:   Bilateral Distance:    Right Eye Near:   Left Eye Near:    Bilateral Near:     Physical Exam  General Appearance:    Alert, cooperative, no distress  HENT:   Normocephalic, ears normal, nares mucosal edema with congestion, rhinorrhea, oropharynx  w/o erythema or edema   Eyes:    PERRL, conjunctiva/corneas clear, EOM's intact        Lungs:     Clear to auscultation bilaterally, respirations unlabored  Heart:    Regular rate and rhythm  Neurologic:   Awake, alert, oriented x 3. No apparent focal neurological           defect.       UC Treatments / Results  Labs (all labs ordered are listed, but only abnormal results are displayed) Labs Reviewed  COVID-19, FLU A+B NAA   Narrative:    Performed at:  [address] [place], [place]  [id number] Lab Director: Rush Farmer MD, Phone:  [phone]    EKG   Radiology No results found.  Procedures Procedures (including critical care time)  Medications Ordered in UC Medications - No data to display  Initial Impression / Assessment and Plan / UC Course  I have reviewed the triage vital signs and the nursing notes.  Pertinent labs & imaging results that were available during my care of the patient were reviewed by me and considered in my medical decision making (see chart for details).    COVID/Flu test pending. Symptom management warranted only.  Tamiflu prescribed. Manage fever with Tylenol and ibuprofen.  Nasal symptoms with over-the-counter antihistamines recommended.  Treatment per discharge medications/discharge instructions.  Red flags/ER precautions given. The most current CDC isolation/quarantine recommendation advised.   Final Clinical Impressions(s) / UC Diagnoses   Final diagnoses:  Exposure to the flu  Viral illness     Discharge Instructions       Continue Motrin and ibuprofen as needed for generalized body aches and/or fever.  Start Tamiflu.   Your COVID 19 results should result within 3-5 days. Negative results are immediately resulted to Mychart. Positive results will receive a follow-up call from our clinic. If symptoms are present, I recommend home quarantine until results are known.  Alternate Tylenol and ibuprofen as needed for body aches and fever.  Symptom management per recommendations discussed today.   If any breathing difficulty or chest pain develops go immediately to the closest emergency department for evaluation.      ED Prescriptions     Medication Sig Dispense Auth. Provider   oseltamivir (TAMIFLU) 75 MG capsule Take 1 capsule (75 mg total) by mouth 2 (two) times daily. 10 capsule Molli Barrows  S, FNP   benzonatate (TESSALON) 100 MG capsule Take 2 capsules (200 mg total) by mouth 3 (three) times daily as needed for cough. 40 capsule Scot Jun, FNP      PDMP not reviewed this encounter.   Scot Jun, FNP 05/14/21 1622

## 2021-05-08 NOTE — Discharge Instructions (Signed)
  Continue Motrin and ibuprofen as needed for generalized body aches and/or fever.  Start Tamiflu.   Your COVID 19 results should result within 3-5 days. Negative results are immediately resulted to Mychart. Positive results will receive a follow-up call from our clinic. If symptoms are present, I recommend home quarantine until results are known.  Alternate Tylenol and ibuprofen as needed for body aches and fever.  Symptom management per recommendations discussed today.  If any breathing difficulty or chest pain develops go immediately to the closest emergency department for evaluation.

## 2021-05-08 NOTE — ED Triage Notes (Signed)
Patient states on Friday she started with a dry cough and yesterday she started a wet cough. Having throat aches and body aches. Neck and low back are also in pain.  Last night she took some Nyquil and it really didn't help. She took decongestive meds at 4am this morning.   Denies Fever

## 2021-05-09 LAB — COVID-19, FLU A+B NAA
Influenza A, NAA: NOT DETECTED
Influenza B, NAA: NOT DETECTED
SARS-CoV-2, NAA: NOT DETECTED

## 2021-06-07 ENCOUNTER — Encounter (INDEPENDENT_AMBULATORY_CARE_PROVIDER_SITE_OTHER): Payer: Self-pay | Admitting: *Deleted

## 2021-07-02 ENCOUNTER — Other Ambulatory Visit (HOSPITAL_COMMUNITY): Payer: Self-pay

## 2021-07-07 ENCOUNTER — Other Ambulatory Visit (HOSPITAL_COMMUNITY): Payer: Self-pay

## 2021-07-12 ENCOUNTER — Encounter (INDEPENDENT_AMBULATORY_CARE_PROVIDER_SITE_OTHER): Payer: Self-pay

## 2021-07-12 ENCOUNTER — Telehealth (INDEPENDENT_AMBULATORY_CARE_PROVIDER_SITE_OTHER): Payer: Self-pay | Admitting: *Deleted

## 2021-07-12 ENCOUNTER — Encounter (INDEPENDENT_AMBULATORY_CARE_PROVIDER_SITE_OTHER): Payer: Self-pay | Admitting: *Deleted

## 2021-07-12 MED ORDER — NA SULFATE-K SULFATE-MG SULF 17.5-3.13-1.6 GM/177ML PO SOLN: 1.0000 | Freq: Once | ORAL | 0 refills | Status: AC

## 2021-07-12 NOTE — Telephone Encounter (Signed)
Referring MD/PCP: vyas  Procedure: tcs  Reason/Indication:  fam hx colon ca  Has patient had this procedure before?  Yes, 06/2016  If so, when, by whom and where?    Is there a family history of colon cancer?  Yes, maternal aunt  Who?  What age when diagnosed?    Is patient diabetic? If yes, Type 1 or Type 2   no      Does patient have prosthetic heart valve or mechanical valve?  no  Do you have a pacemaker/defibrillator?  no  Has patient ever had endocarditis/atrial fibrillation? no  Does patient use oxygen? no  Has patient had joint replacement within last 12 months?  no  Is patient constipated or do they take laxatives? no  Does patient have a history of alcohol/drug use?  no  Have you had a stroke/heart attack last 6 mths? no  Do you take medicine for weight loss?  no  For female patients,: have you had a hysterectomy yes                      are you post menopausal                       do you still have your menstrual cycle   Is patient on blood thinner such as Coumadin, Plavix and/or Aspirin? no  Medications: ibuprofen prn, tylenol prn  Allergies: morphine  Medication Adjustment per Dr Rehman/Dr Jenetta Downer   Procedure date & time: 07/28/21

## 2021-07-12 NOTE — Telephone Encounter (Signed)
Patient needs suprep 

## 2021-07-13 ENCOUNTER — Other Ambulatory Visit (INDEPENDENT_AMBULATORY_CARE_PROVIDER_SITE_OTHER): Payer: Self-pay

## 2021-07-13 DIAGNOSIS — Z800 Family history of malignant neoplasm of digestive organs: Secondary | ICD-10-CM

## 2021-07-13 DIAGNOSIS — Z299 Encounter for prophylactic measures, unspecified: Secondary | ICD-10-CM | POA: Diagnosis not present

## 2021-07-13 DIAGNOSIS — Z86010 Personal history of colonic polyps: Secondary | ICD-10-CM

## 2021-07-13 DIAGNOSIS — Z0000 Encounter for general adult medical examination without abnormal findings: Secondary | ICD-10-CM | POA: Diagnosis not present

## 2021-07-13 DIAGNOSIS — Z1331 Encounter for screening for depression: Secondary | ICD-10-CM | POA: Diagnosis not present

## 2021-07-13 DIAGNOSIS — Z6826 Body mass index (BMI) 26.0-26.9, adult: Secondary | ICD-10-CM | POA: Diagnosis not present

## 2021-07-13 DIAGNOSIS — E559 Vitamin D deficiency, unspecified: Secondary | ICD-10-CM | POA: Diagnosis not present

## 2021-07-13 DIAGNOSIS — R5383 Other fatigue: Secondary | ICD-10-CM | POA: Diagnosis not present

## 2021-07-13 DIAGNOSIS — Z79899 Other long term (current) drug therapy: Secondary | ICD-10-CM | POA: Diagnosis not present

## 2021-07-13 DIAGNOSIS — Z789 Other specified health status: Secondary | ICD-10-CM | POA: Diagnosis not present

## 2021-07-14 ENCOUNTER — Other Ambulatory Visit (HOSPITAL_COMMUNITY): Payer: Self-pay

## 2021-07-14 MED ORDER — ERGOCALCIFEROL 1.25 MG (50000 UT) PO CAPS
ORAL_CAPSULE | ORAL | 3 refills | Status: DC
  Filled 2021-07-14: qty 25, 84d supply, fill #0
  Filled 2021-10-24: qty 25, 84d supply, fill #1
  Filled 2022-04-30: qty 25, 84d supply, fill #2

## 2021-07-15 ENCOUNTER — Other Ambulatory Visit (INDEPENDENT_AMBULATORY_CARE_PROVIDER_SITE_OTHER): Payer: Self-pay

## 2021-07-15 DIAGNOSIS — Z800 Family history of malignant neoplasm of digestive organs: Secondary | ICD-10-CM

## 2021-07-15 DIAGNOSIS — R1310 Dysphagia, unspecified: Secondary | ICD-10-CM

## 2021-07-19 ENCOUNTER — Other Ambulatory Visit (INDEPENDENT_AMBULATORY_CARE_PROVIDER_SITE_OTHER): Payer: Self-pay

## 2021-07-22 ENCOUNTER — Encounter (HOSPITAL_COMMUNITY): Payer: Self-pay

## 2021-07-22 NOTE — Patient Instructions (Signed)
Monica Simpson  07/22/2021     @PREFPERIOPPHARMACY @   Your procedure is scheduled on 07/28/2021.  Report to [facility] at 11:30 A.M.  Call this number if you have problems the morning of surgery:  [phone]   Remember: Please Follow the Diet and Prep Instructions given to you by Dr Olevia Perches office.                          Take these medicines the morning of surgery with A SIP OF WATER : none    Do not wear jewelry, make-up or nail polish.  Do not wear lotions, powders, or perfumes, or deodorant.  Do not shave 48 hours prior to surgery.  Men may shave face and neck.  Do not bring valuables to the hospital.  [facility] is not responsible for any belongings or valuables.  Contacts, dentures or bridgework may not be worn into surgery.  Leave your suitcase in the car.  After surgery it may be brought to your room.  For patients admitted to the hospital, discharge time will be determined by your treatment team.  Patients discharged the day of surgery will not be allowed to drive home.   Name and phone number of your driver:   Monica Simpson Special instructions:  N/A  Please read over the following fact sheets that you were given. Care and Recovery After Surgery     Upper Endoscopy, Adult Upper endoscopy is a procedure to look inside the upper GI (gastrointestinal) tract. The upper GI tract is made up of: The part of the body that moves food from your mouth to your stomach (esophagus). The stomach. The first part of your small intestine (duodenum). This procedure is also called esophagogastroduodenoscopy (EGD) or gastroscopy. In this procedure, your health care provider passes a thin, flexible tube (endoscope) through your mouth and down your esophagus into your stomach. A small camera is attached to the end of the tube. Images from the camera appear on a monitor in the exam room. During this procedure, your health care provider may also remove a small piece of tissue to be  sent to a lab and examined under a microscope (biopsy). Your health care provider may do an upper endoscopy to diagnose cancers of the upper GI tract. You may also have this procedure to find the cause of other conditions, such as: Stomach pain. Heartburn. Pain or problems when swallowing. Nausea and vomiting. Stomach bleeding. Stomach ulcers. Tell a health care provider about: Any allergies you have. All medicines you are taking, including vitamins, herbs, eye drops, creams, and over-the-counter medicines. Any problems you or family members have had with anesthetic medicines. Any blood disorders you have. Any surgeries you have had. Any medical conditions you have. Whether you are pregnant or may be pregnant. What are the risks? Generally, this is a safe procedure. However, problems may occur, including: Infection. Bleeding. Allergic reactions to medicines. A tear or hole (perforation) in the esophagus, stomach, or duodenum. What happens before the procedure? Staying hydrated Follow instructions from your health care provider about hydration, which may include: Up to 2 hours before the procedure - you may continue to drink clear liquids, such as water, clear fruit juice, black coffee, and plain tea.  Eating and drinking restrictions Follow instructions from your health care provider about eating and drinking, which may include: 8 hours before the procedure - stop eating heavy meals or foods, such as meat, fried foods,  or fatty foods. 6 hours before the procedure - stop eating light meals or foods, such as toast or cereal. 6 hours before the procedure - stop drinking milk or drinks that contain milk. 2 hours before the procedure - stop drinking clear liquids. Medicines Ask your health care provider about: Changing or stopping your regular medicines. This is especially important if you are taking diabetes medicines or blood thinners. Taking medicines such as aspirin and ibuprofen.  These medicines can thin your blood. Do not take these medicines unless your health care provider tells you to take them. Taking over-the-counter medicines, vitamins, herbs, and supplements. General instructions Plan to have someone take you home from the hospital or clinic. If you will be going home right after the procedure, plan to have someone with you for 24 hours. Ask your health care provider what steps will be taken to help prevent infection. What happens during the procedure?  An IV will be inserted into one of your veins. You may be given one or more of the following: A medicine to help you relax (sedative). A medicine to numb the throat (local anesthetic). You will lie on your left side on an exam table. Your health care provider will pass the endoscope through your mouth and down your esophagus. Your health care provider will use the scope to check the inside of your esophagus, stomach, and duodenum. Biopsies may be taken. The endoscope will be removed. The procedure may vary among health care providers and hospitals. What happens after the procedure? Your blood pressure, heart rate, breathing rate, and blood oxygen level will be monitored until you leave the hospital or clinic. Do not drive for 24 hours if you were given a sedative during your procedure. When your throat is no longer numb, you may be given some fluids to drink. It is up to you to get the results of your procedure. Ask your health care provider, or the department that is doing the procedure, when your results will be ready. Summary Upper endoscopy is a procedure to look inside the upper GI tract. During the procedure, an IV will be inserted into one of your veins. You may be given a medicine to help you relax. A medicine will be used to numb your throat. The endoscope will be passed through your mouth and down your esophagus. This information is not intended to replace advice given to you by your health care  provider. Make sure you discuss any questions you have with your health care provider. Document Revised: 11/21/2017 Document Reviewed: 10/29/2017 Elsevier Patient Education  2022 [employer]. Colonoscopy, Adult A colonoscopy is a procedure to look at the entire large intestine. This procedure is done using a long, thin, flexible tube that has a camera on the end. You may have a colonoscopy: As a part of normal colorectal screening. If you have certain symptoms, such as: A low number of red blood cells in your blood (anemia). Diarrhea that does not go away. Pain in your abdomen. Blood in your stool. A colonoscopy can help screen for and diagnose medical problems, including: Tumors. Extra tissue that grows where mucus forms (polyps). Inflammation. Areas of bleeding. Tell your health care provider about: Any allergies you have. All medicines you are taking, including vitamins, herbs, eye drops, creams, and over-the-counter medicines. Any problems you or family members have had with anesthetic medicines. Any blood disorders you have. Any surgeries you have had. Any medical conditions you have. Any problems you have had with having  bowel movements. Whether you are pregnant or may be pregnant. What are the risks? Generally, this is a safe procedure. However, problems may occur, including: Bleeding. Damage to your intestine. Allergic reactions to medicines given during the procedure. Infection. This is rare. What happens before the procedure? Eating and drinking restrictions Follow instructions from your health care provider about eating or drinking restrictions, which may include: A few days before the procedure: Follow a low-fiber diet. Avoid nuts, seeds, dried fruit, raw fruits, and vegetables. 1-3 days before the procedure: Eat only gelatin dessert or ice pops. Drink only clear liquids, such as water, clear juice, clear broth or bouillon, black coffee or tea, or clear soft  drinks or sports drinks. Avoid liquids that contain red or purple dye. The day of the procedure: Do not eat solid foods. You may continue to drink clear liquids until up to 2 hours before the procedure. Do not eat or drink anything starting 2 hours before the procedure, or within the time period that your health care provider recommends. Bowel prep If you were prescribed a bowel prep to take by mouth (orally) to clean out your colon: Take it as told by your health care provider. Starting the day before your procedure, you will need to drink a large amount of liquid medicine. The liquid will cause you to have many bowel movements of loose stool until your stool becomes almost clear or light green. If your skin or the opening between the buttocks (anus) gets irritated from diarrhea, you may relieve the irritation using: Wipes with medicine in them, such as adult wet wipes with aloe and vitamin E. A product to soothe skin, such as petroleum jelly. If you vomit while drinking the bowel prep: Take a break for up to 60 minutes. Begin the bowel prep again. Call your health care provider if you keep vomiting or you cannot take the bowel prep without vomiting. To clean out your colon, you may also be given: Laxative medicines. These help you have a bowel movement. Instructions for enema use. An enema is liquid medicine injected into your rectum. Medicines Ask your health care provider about: Changing or stopping your regular medicines or supplements. This is especially important if you are taking iron supplements, diabetes medicines, or blood thinners. Taking medicines such as aspirin and ibuprofen. These medicines can thin your blood. Do not take these medicines unless your health care provider tells you to take them. Taking over-the-counter medicines, vitamins, herbs, and supplements. General instructions Ask your health care provider what steps will be taken to help prevent infection. These may  include washing skin with a germ-killing soap. Plan to have someone take you home from the hospital or clinic. What happens during the procedure?  An IV will be inserted into one of your veins. You may be given one or more of the following: A medicine to help you relax (sedative). A medicine to numb the area (local anesthetic). A medicine to make you fall asleep (general anesthetic). This is rarely needed. You will lie on your side with your knees bent. The tube will: Have oil or gel put on it (be lubricated). Be inserted into your anus. Be gently eased through all parts of your large intestine. Air will be sent into your colon to keep it open. This may cause some pressure or cramping. Images will be taken with the camera and will appear on a screen. A small tissue sample may be removed to be looked at under a microscope (biopsy).  The tissue may be sent to a lab for testing if any signs of problems are found. If small polyps are found, they may be removed and checked for cancer cells. When the procedure is finished, the tube will be removed. The procedure may vary among health care providers and hospitals. What happens after the procedure? Your blood pressure, heart rate, breathing rate, and blood oxygen level will be monitored until you leave the hospital or clinic. You may have a small amount of blood in your stool. You may pass gas and have mild cramping or bloating in your abdomen. This is caused by the air that was used to open your colon during the exam. Do not drive for 24 hours after the procedure. It is up to you to get the results of your procedure. Ask your health care provider, or the department that is doing the procedure, when your results will be ready. Summary A colonoscopy is a procedure to look at the entire large intestine. Follow instructions from your health care provider about eating and drinking before the procedure. If you were prescribed an oral bowel prep to  clean out your colon, take it as told by your health care provider. During the colonoscopy, a flexible tube with a camera on its end is inserted into the anus and then passed into the other parts of the large intestine. This information is not intended to replace advice given to you by your health care provider. Make sure you discuss any questions you have with your health care provider. Document Revised: 12/20/2018 Document Reviewed: 12/20/2018 Elsevier Patient Education  [place].

## 2021-07-25 ENCOUNTER — Encounter (HOSPITAL_COMMUNITY)
Admission: RE | Admit: 2021-07-25 | Discharge: 2021-07-25 | Disposition: A | Discharge status: Home / Self Care | Payer: 59 | Source: Ambulatory Visit | Attending: Internal Medicine | Admitting: Internal Medicine

## 2021-07-25 ENCOUNTER — Encounter (HOSPITAL_COMMUNITY): Payer: 59

## 2021-07-25 HISTORY — DX: Nausea with vomiting, unspecified: R11.2

## 2021-07-25 HISTORY — DX: Gastro-esophageal reflux disease without esophagitis: K21.9

## 2021-07-25 HISTORY — DX: Other specified postprocedural states: Z98.890

## 2021-07-28 ENCOUNTER — Ambulatory Visit (HOSPITAL_BASED_OUTPATIENT_CLINIC_OR_DEPARTMENT_OTHER): Payer: 59 | Admitting: Certified Registered Nurse Anesthetist

## 2021-07-28 ENCOUNTER — Other Ambulatory Visit: Payer: Self-pay

## 2021-07-28 ENCOUNTER — Encounter (INDEPENDENT_AMBULATORY_CARE_PROVIDER_SITE_OTHER): Payer: Self-pay | Admitting: *Deleted

## 2021-07-28 ENCOUNTER — Ambulatory Visit (HOSPITAL_COMMUNITY)
Admission: RE | Admit: 2021-07-28 | Discharge: 2021-07-28 | Disposition: A | Discharge status: Home / Self Care | Payer: 59 | Attending: Internal Medicine | Admitting: Internal Medicine

## 2021-07-28 ENCOUNTER — Encounter (HOSPITAL_COMMUNITY)
Admission: RE | Disposition: A | Discharge status: Home / Self Care | Payer: Self-pay | Source: Home / Self Care | Attending: Internal Medicine

## 2021-07-28 ENCOUNTER — Encounter (HOSPITAL_COMMUNITY): Payer: Self-pay | Admitting: Internal Medicine

## 2021-07-28 ENCOUNTER — Ambulatory Visit (HOSPITAL_COMMUNITY): Payer: 59 | Admitting: Certified Registered Nurse Anesthetist

## 2021-07-28 DIAGNOSIS — K222 Esophageal obstruction: Secondary | ICD-10-CM | POA: Diagnosis not present

## 2021-07-28 DIAGNOSIS — Z86010 Personal history of colonic polyps: Secondary | ICD-10-CM | POA: Insufficient documentation

## 2021-07-28 DIAGNOSIS — R1314 Dysphagia, pharyngoesophageal phase: Secondary | ICD-10-CM | POA: Diagnosis not present

## 2021-07-28 DIAGNOSIS — K644 Residual hemorrhoidal skin tags: Secondary | ICD-10-CM | POA: Diagnosis not present

## 2021-07-28 DIAGNOSIS — K449 Diaphragmatic hernia without obstruction or gangrene: Secondary | ICD-10-CM | POA: Diagnosis not present

## 2021-07-28 DIAGNOSIS — K219 Gastro-esophageal reflux disease without esophagitis: Secondary | ICD-10-CM | POA: Insufficient documentation

## 2021-07-28 DIAGNOSIS — Z1211 Encounter for screening for malignant neoplasm of colon: Secondary | ICD-10-CM | POA: Insufficient documentation

## 2021-07-28 DIAGNOSIS — K317 Polyp of stomach and duodenum: Secondary | ICD-10-CM | POA: Diagnosis not present

## 2021-07-28 HISTORY — PX: ESOPHAGOGASTRODUODENOSCOPY (EGD) WITH PROPOFOL: SHX5813

## 2021-07-28 HISTORY — PX: ESOPHAGEAL DILATION: SHX303

## 2021-07-28 HISTORY — PX: COLONOSCOPY WITH PROPOFOL: SHX5780

## 2021-07-28 HISTORY — PX: POLYPECTOMY: SHX5525

## 2021-07-28 LAB — HM COLONOSCOPY

## 2021-07-28 SURGERY — COLONOSCOPY WITH PROPOFOL
Anesthesia: General

## 2021-07-28 SURGERY — COLONOSCOPY WITH PROPOFOL
Anesthesia: Monitor Anesthesia Care

## 2021-07-28 MED ORDER — PROPOFOL 10 MG/ML IV BOLUS
INTRAVENOUS | Status: DC | PRN
  Administered 2021-07-28: 100 mg via INTRAVENOUS
  Administered 2021-07-28 (×4): 20 mg via INTRAVENOUS

## 2021-07-28 MED ORDER — PROPOFOL 500 MG/50ML IV EMUL
INTRAVENOUS | Status: DC | PRN
  Administered 2021-07-28: 150 ug/kg/min via INTRAVENOUS

## 2021-07-28 MED ORDER — LIDOCAINE HCL (CARDIAC) PF 100 MG/5ML IV SOSY
PREFILLED_SYRINGE | INTRAVENOUS | Status: DC | PRN
Start: 2021-07-28 — End: 2021-07-28
  Administered 2021-07-28: 50 mg via INTRAVENOUS

## 2021-07-28 MED ORDER — LACTATED RINGERS IV SOLN: INTRAVENOUS | Status: DC | PRN

## 2021-07-28 NOTE — Anesthesia Preprocedure Evaluation (Signed)
Anesthesia Evaluation  Patient identified by MRN, date of birth, ID band Patient awake    Reviewed: Allergy & Precautions, H&P , NPO status , Patient's Chart, lab work & pertinent test results, reviewed documented beta blocker date and time   History of Anesthesia Complications (+) PONV and history of anesthetic complications  Airway Mallampati: II  TM Distance: >3 FB Neck ROM: full    Dental no notable dental hx.    Pulmonary neg pulmonary ROS,    Pulmonary exam normal breath sounds clear to auscultation       Cardiovascular Exercise Tolerance: Good negative cardio ROS   Rhythm:regular Rate:Normal     Neuro/Psych negative neurological ROS  negative psych ROS   GI/Hepatic negative GI ROS, Neg liver ROS, GERD  Medicated,  Endo/Other  negative endocrine ROS  Renal/GU negative Renal ROS  negative genitourinary   Musculoskeletal   Abdominal   Peds  Hematology negative hematology ROS (+)   Anesthesia Other Findings   Reproductive/Obstetrics negative OB ROS                             Anesthesia Physical Anesthesia Plan  ASA: 2  Anesthesia Plan: General   Post-op Pain Management:    Induction:   PONV Risk Score and Plan:   Airway Management Planned:   Additional Equipment:   Intra-op Plan:   Post-operative Plan:   Informed Consent: I have reviewed the patients History and Physical, chart, labs and discussed the procedure including the risks, benefits and alternatives for the proposed anesthesia with the patient or authorized representative who has indicated his/her understanding and acceptance.     Dental Advisory Given  Plan Discussed with: CRNA  Anesthesia Plan Comments:         Anesthesia Quick Evaluation

## 2021-07-28 NOTE — Op Note (Signed)
[facility] Patient Name: Monica Simpson Procedure Date: 07/28/2021 11:57 AM MRN: [id number] Date of Birth: 07/28/1965 Attending MD: Hildred Laser , MD CSN: [id number] Age: 56 Admit Type: Outpatient Procedure:                Upper GI endoscopy Indications:              Esophageal dysphagia Providers:                Hildred Laser, MD, Caprice Kluver, [place] Referring MD:             Jerene Bears, MD Medicines:                Propofol per Anesthesia Complications:            No immediate complications. Estimated Blood Loss:     Estimated blood loss was minimal. Procedure:                Pre-Anesthesia Assessment:                           - Prior to the procedure, a History and Physical                            was performed, and patient medications and                            allergies were reviewed. The patient's tolerance of                            previous anesthesia was also reviewed. The risks                            and benefits of the procedure and the sedation                            options and risks were discussed with the patient.                            All questions were answered, and informed consent                            was obtained. Prior Anticoagulants: The patient has                            taken no previous anticoagulant or antiplatelet                            agents except for NSAID medication. ASA Grade                            Assessment: II - A patient with mild systemic                            disease. After reviewing the risks and benefits,  the patient was deemed in satisfactory condition to                            undergo the procedure.                           After obtaining informed consent, the endoscope was                            passed under direct vision. Throughout the                            procedure, the patient's blood pressure, pulse, and                             oxygen saturations were monitored continuously. The                            [id number]-H190 ([id number]) scope was introduced through the                            mouth, and advanced to the second part of duodenum.                            The upper GI endoscopy was accomplished without                            difficulty. The patient tolerated the procedure                            well. Scope In: 12:19:33 PM Scope Out: 12:30:20 PM Total Procedure Duration: 0 hours 10 minutes 47 seconds  Findings:      The hypopharynx was normal.      The examined esophagus was normal.      A widely patent Schatzki ring was found at the gastroesophageal junction.      A 2 cm hiatal hernia was present.      No endoscopic abnormality was evident in the esophagus to explain the       patient's complaint of dysphagia. It was decided, however, to proceed       with dilation of the entire esophagus. The dilation site was examined       following endoscope reinsertion and showed no change and no bleeding,       mucosal tear or perforation.      A few small sessile polyps with no stigmata of recent bleeding were       found in the gastric fundus and in the gastric body. Biopsies were taken       with a cold forceps for histology.      The exam of the stomach was otherwise normal.      The duodenal bulb and second portion of the duodenum were normal. Impression:               - Normal hypopharynx.                           -  Normal esophagus.                           - Widely patent Schatzki ring.                           - 2 cm hiatal hernia.                           - No endoscopic esophageal abnormality to explain                            patient's dysphagia. Esophagus dilated.                           - A few gastric polyps. Biopsied.                           - Normal duodenal bulb and second portion of the                            duodenum. Moderate Sedation:      Per Anesthesia  Care Recommendation:           - Resume previous diet today.                           - Continue present medications.                           - No aspirin, ibuprofen, naproxen, or other                            non-steroidal anti-inflammatory drugs for 1 day.                           - Await pathology results. Procedure Code(s):        --- Professional ---                           [phone], Esophagogastroduodenoscopy, flexible,                            transoral; with biopsy, single or multiple Diagnosis Code(s):        --- Professional ---                           K22.2, Esophageal obstruction                           K44.9, Diaphragmatic hernia without obstruction or                            gangrene                           K31.7, Polyp of stomach and duodenum  R13.14, Dysphagia, pharyngoesophageal phase CPT copyright 2019 American Medical Association. All rights reserved. The codes documented in this report are preliminary and upon coder review may  be revised to meet current compliance requirements. Hildred Laser, MD Hildred Laser, MD 07/28/2021 12:53:38 PM This report has been signed electronically. Number of Addenda: 0

## 2021-07-28 NOTE — Anesthesia Postprocedure Evaluation (Signed)
Anesthesia Post Note  Patient: Monica Simpson  Procedure(s) Performed: COLONOSCOPY WITH PROPOFOL ESOPHAGOGASTRODUODENOSCOPY (EGD) WITH PROPOFOL ESOPHAGEAL DILATION POLYPECTOMY  Patient location during evaluation: Phase II Anesthesia Type: General Level of consciousness: awake Pain management: pain level controlled Vital Signs Assessment: post-procedure vital signs reviewed and stable Respiratory status: spontaneous breathing and respiratory function stable Cardiovascular status: blood pressure returned to baseline and stable Postop Assessment: no headache and no apparent nausea or vomiting Anesthetic complications: no Comments: Late entry   No notable events documented.   Last Vitals:  Vitals:   07/28/21 1153 07/28/21 1250  BP: 104/67   Pulse: 81 92  Resp: 15 14  Temp: 36.7 C 36.7 C  SpO2: 96% 100%    Last Pain:  Vitals:   07/28/21 1250  TempSrc: Oral  PainSc: 0-No pain                 Louann Sjogren

## 2021-07-28 NOTE — Discharge Instructions (Signed)
No aspirin or NSAIDs for 24 hours. Resume usual medications and diet as before. No driving for 24 hours. Next colonoscopy in 5 years.

## 2021-07-28 NOTE — H&P (Signed)
Monica Simpson is an 56 y.o. female.   Chief Complaint: Patient is here for esophagogastroduodenoscopy with esophageal dilation followed by colonoscopy. HPI: Patient is 56 year old Caucasian female who presents with few months history of intermittent dysphagia primarily to bread and meat and occasionally to potatoes.  She points to suprasternal area as site of bolus obstruction.  She only has heartburn with certain foods which is not very often.  Her appetite is good and her weight is stable.  She has a history of colonic polyps.  Her last colonoscopy about 5 years ago did not reveal any polyps.  Family history is significant.  Her mother has had multiple polyps removed.  She has older brother who has had polyps removed and her maternal aunt died of colon carcinoma in her 45s. She does not take aspirin or anticoagulants.  Past Medical History:  Diagnosis Date   Colon polyps    GERD (gastroesophageal reflux disease)    PONV (postoperative nausea and vomiting)     Past Surgical History:  Procedure Laterality Date   ABDOMINAL HYSTERECTOMY     ANTERIOR AND POSTERIOR VAGINAL REPAIR     BLADDER SUSPENSION     COLONOSCOPY  02/23/2011   Procedure: COLONOSCOPY;  Surgeon: Rogene Houston, MD;  Location: [facility];  Service: Endoscopy;  Laterality: N/A;   COLONOSCOPY N/A 06/14/2016   Procedure: COLONOSCOPY;  Surgeon: Rogene Houston, MD;  Location: [facility];  Service: Endoscopy;  Laterality: N/A;  930 - moved to 1/3 @ 7:30 - Ann notified pt   DILATION AND CURETTAGE OF UTERUS     TONSILLECTOMY     WISDOM TOOTH EXTRACTION      Family History  Problem Relation Age of Onset   Colon cancer Other    Breast cancer Mother 32   Social History:  reports that she has never smoked. She has never used smokeless tobacco. She reports that she does not drink alcohol and does not use drugs.  Allergies:  Allergies  Allergen Reactions   Morphine And Related Nausea And Vomiting    Medications Prior  to Admission  Medication Sig Dispense Refill   ergocalciferol (VITAMIN D2) 1.25 MG (50000 UT) capsule Take 1 capsule by mouth twice a week. 25 capsule 3   estradiol (ESTRACE) 0.1 MG/GM vaginal cream Insert 1/2 gram vaginally twice a week at bedtime. 42.5 g 12   ibuprofen (ADVIL,MOTRIN) 200 MG tablet Take 400 mg by mouth every 6 (six) hours as needed for headache or moderate pain.     benzonatate (TESSALON) 100 MG capsule Take 2 capsules (200 mg total) by mouth 3 (three) times daily as needed for cough. (Patient not taking: Reported on 07/20/2021) 40 capsule 0   oseltamivir (TAMIFLU) 75 MG capsule Take 1 capsule (75 mg total) by mouth 2 (two) times daily. (Patient not taking: Reported on 07/20/2021) 10 capsule 0    No results found for this or any previous visit (from the past 43 hour(s)). No results found.  Review of Systems  Blood pressure 104/67, pulse 81, temperature 98.1 F (36.7 C), resp. rate 15, SpO2 96 %. Physical Exam HENT:     Mouth/Throat:     Mouth: Mucous membranes are moist.     Pharynx: Oropharynx is clear.  Eyes:     General: No scleral icterus.    Conjunctiva/sclera: Conjunctivae normal.  Cardiovascular:     Rate and Rhythm: Normal rate and regular rhythm.     Heart sounds: Normal heart sounds. No murmur  heard. Pulmonary:     Effort: Pulmonary effort is normal.     Breath sounds: Normal breath sounds.  Abdominal:     General: There is no distension.     Palpations: Abdomen is soft. There is no mass.     Tenderness: There is no abdominal tenderness.  Musculoskeletal:        General: No swelling.     Cervical back: Neck supple.  Lymphadenopathy:     Cervical: No cervical adenopathy.  Skin:    General: Skin is warm and dry.  Neurological:     Mental Status: She is alert.     Assessment/Plan  Esophageal dysphagia. History of colonic adenomas. Family history significant for multiple colonic adenomas in mother and a brother.  Maternal aunt died of colon  carcinoma in her 13s. Esophagogastroduodenoscopy with esophageal dilation and surveillance colonoscopy.  Hildred Laser, MD 07/28/2021, 12:10 PM

## 2021-07-28 NOTE — Transfer of Care (Signed)
Immediate Anesthesia Transfer of Care Note  Patient: Monica Simpson  Procedure(s) Performed: COLONOSCOPY WITH PROPOFOL ESOPHAGOGASTRODUODENOSCOPY (EGD) WITH PROPOFOL ESOPHAGEAL DILATION POLYPECTOMY  Patient Location: Short Stay  Anesthesia Type:General  Level of Consciousness: awake  Airway & Oxygen Therapy: Patient Spontanous Breathing  Post-op Assessment: Report given to RN and Post -op Vital signs reviewed and stable  Post vital signs: Reviewed and stable  Last Vitals:  Vitals Value Taken Time  BP    Temp 36.7 C 07/28/21 1250  Pulse 92 07/28/21 1250  Resp 14 07/28/21 1250  SpO2 100 % 07/28/21 1250    Last Pain:  Vitals:   07/28/21 1250  TempSrc: Oral  PainSc: 0-No pain         Complications: No notable events documented.

## 2021-07-28 NOTE — Op Note (Signed)
[facility] Patient Name: Monica Simpson Procedure Date: 07/28/2021 11:56 AM MRN: [id number] Date of Birth: [birth year] Attending MD: Hildred Laser , MD CSN: [id number] Age: 56 Admit Type: Outpatient Procedure:                Colonoscopy Indications:              High risk colon cancer surveillance: Personal                            history of colonic polyps Providers:                Hildred Laser, MD, Caprice Kluver, [place] Page Referring MD:             Glenda Chroman, MD Medicines:                Propofol per Anesthesia Complications:            No immediate complications. Estimated Blood Loss:     Estimated blood loss: none. Procedure:                Pre-Anesthesia Assessment:                           - Prior to the procedure, a History and Physical                            was performed, and patient medications and                            allergies were reviewed. The patient's tolerance of                            previous anesthesia was also reviewed. The risks                            and benefits of the procedure and the sedation                            options and risks were discussed with the patient.                            All questions were answered, and informed consent                            was obtained. Prior Anticoagulants: The patient has                            taken no previous anticoagulant or antiplatelet                            agents except for NSAID medication. ASA Grade                            Assessment: II - A patient with mild systemic  disease. After reviewing the risks and benefits,                            the patient was deemed in satisfactory condition to                            undergo the procedure.                           After obtaining informed consent, the colonoscope                            was passed under direct vision. Throughout the                            procedure,  the patient's blood pressure, pulse, and                            oxygen saturations were monitored continuously. The                            [id number]-HQ190L ([id number]) scope was introduced through                            the anus and advanced to the the cecum, identified                            by appendiceal orifice and ileocecal valve. The                            colonoscopy was performed without difficulty. The                            patient tolerated the procedure well. The quality                            of the bowel preparation was excellent. The                            ileocecal valve, appendiceal orifice, and rectum                            were photographed. Scope In: 12:34:35 PM Scope Out: 12:45:40 PM Scope Withdrawal Time: 0 hours 6 minutes 18 seconds  Total Procedure Duration: 0 hours 11 minutes 5 seconds  Findings:      The perianal and digital rectal examinations were normal.      The colon (entire examined portion) appeared normal.      External hemorrhoids were found during retroflexion. The hemorrhoids       were small. Impression:               - The entire examined colon is normal.                           - External hemorrhoids.                           -  No specimens collected. Moderate Sedation:      Per Anesthesia Care Recommendation:           - Patient has a contact number available for                            emergencies. The signs and symptoms of potential                            delayed complications were discussed with the                            patient. Return to normal activities tomorrow.                            Written discharge instructions were provided to the                            patient.                           - Resume previous diet today.                           - Continue present medications.                           - Repeat colonoscopy in 5 years for surveillance. Procedure Code(s):        ---  Professional ---                           [phone], Colonoscopy, flexible; diagnostic, including                            collection of specimen(s) by brushing or washing,                            when performed (separate procedure) Diagnosis Code(s):        --- Professional ---                           Z86.010, Personal history of colonic polyps                           K64.4, Residual hemorrhoidal skin tags CPT copyright 2019 [employer]. All rights reserved. The codes documented in this report are preliminary and upon coder review may  be revised to meet current compliance requirements. Hildred Laser, MD Hildred Laser, MD 07/28/2021 12:57:02 PM This report has been signed electronically. Number of Addenda: 0

## 2021-07-29 LAB — SURGICAL PATHOLOGY

## 2021-08-02 ENCOUNTER — Encounter (HOSPITAL_COMMUNITY): Payer: Self-pay | Admitting: Internal Medicine

## 2021-10-12 DIAGNOSIS — Z1231 Encounter for screening mammogram for malignant neoplasm of breast: Secondary | ICD-10-CM | POA: Diagnosis not present

## 2021-10-12 DIAGNOSIS — Z01419 Encounter for gynecological examination (general) (routine) without abnormal findings: Secondary | ICD-10-CM | POA: Diagnosis not present

## 2021-10-12 DIAGNOSIS — Z13820 Encounter for screening for osteoporosis: Secondary | ICD-10-CM | POA: Diagnosis not present

## 2021-10-12 DIAGNOSIS — Z6832 Body mass index (BMI) 32.0-32.9, adult: Secondary | ICD-10-CM | POA: Diagnosis not present

## 2021-10-12 DIAGNOSIS — N952 Postmenopausal atrophic vaginitis: Secondary | ICD-10-CM | POA: Diagnosis not present

## 2021-10-12 DIAGNOSIS — Z1272 Encounter for screening for malignant neoplasm of vagina: Secondary | ICD-10-CM | POA: Diagnosis not present

## 2021-10-13 ENCOUNTER — Other Ambulatory Visit (HOSPITAL_COMMUNITY): Payer: Self-pay

## 2021-10-13 MED ORDER — ESTRADIOL 0.1 MG/GM VA CREA
TOPICAL_CREAM | VAGINAL | 12 refills | Status: AC
  Filled 2021-10-13: qty 42.5, 90d supply, fill #0
  Filled 2022-04-16: qty 42.5, 90d supply, fill #1

## 2021-10-24 ENCOUNTER — Other Ambulatory Visit (HOSPITAL_COMMUNITY): Payer: Self-pay

## 2021-12-29 DIAGNOSIS — H52223 Regular astigmatism, bilateral: Secondary | ICD-10-CM | POA: Diagnosis not present

## 2021-12-29 DIAGNOSIS — H524 Presbyopia: Secondary | ICD-10-CM | POA: Diagnosis not present

## 2021-12-29 DIAGNOSIS — H40013 Open angle with borderline findings, low risk, bilateral: Secondary | ICD-10-CM | POA: Diagnosis not present

## 2021-12-29 DIAGNOSIS — H5203 Hypermetropia, bilateral: Secondary | ICD-10-CM | POA: Diagnosis not present

## 2022-04-16 ENCOUNTER — Other Ambulatory Visit (HOSPITAL_COMMUNITY): Payer: Self-pay

## 2022-04-17 ENCOUNTER — Other Ambulatory Visit (HOSPITAL_COMMUNITY): Payer: Self-pay

## 2022-04-30 ENCOUNTER — Other Ambulatory Visit (HOSPITAL_COMMUNITY): Payer: Self-pay

## 2022-05-01 ENCOUNTER — Other Ambulatory Visit (HOSPITAL_COMMUNITY): Payer: Self-pay

## 2022-06-10 ENCOUNTER — Telehealth: Payer: 59 | Admitting: Physician Assistant

## 2022-06-10 DIAGNOSIS — N3000 Acute cystitis without hematuria: Secondary | ICD-10-CM

## 2022-06-10 MED ORDER — NITROFURANTOIN MONOHYD MACRO 100 MG PO CAPS: 100.0000 mg | ORAL_CAPSULE | Freq: Two times a day (BID) | ORAL | 0 refills | Status: AC

## 2022-06-10 NOTE — Progress Notes (Signed)
E-Visit for Urinary Problems  We are sorry that you are not feeling well.  Here is how we plan to help!  Based on what you shared with me it looks like you most likely have a simple urinary tract infection.  A UTI (Urinary Tract Infection) is a bacterial infection of the bladder.  Most cases of urinary tract infections are simple to treat but a key part of your care is to encourage you to drink plenty of fluids and watch your symptoms carefully.  I have prescribed MacroBid 100 mg twice a day for 5 days.  Your symptoms should gradually improve. Call us if the burning in your urine worsens, you develop worsening fever, back pain or pelvic pain or if your symptoms do not resolve after completing the antibiotic.  Urinary tract infections can be prevented by drinking plenty of water to keep your body hydrated.  Also be sure when you wipe, wipe from front to back and don't hold it in!  If possible, empty your bladder every 4 hours.  HOME CARE Drink plenty of fluids Compete the full course of the antibiotics even if the symptoms resolve Remember, when you need to go.go. Holding in your urine can increase the likelihood of getting a UTI! GET HELP RIGHT AWAY IF: You cannot urinate You get a high fever Worsening back pain occurs You see blood in your urine You feel sick to your stomach or throw up You feel like you are going to pass out  MAKE SURE YOU  Understand these instructions. Will watch your condition. Will get help right away if you are not doing well or get worse.   Thank you for choosing an e-visit.  Your e-visit answers were reviewed by a board certified advanced clinical practitioner to complete your personal care plan. Depending upon the condition, your plan could have included both over the counter or prescription medications.  Please review your pharmacy choice. Make sure the pharmacy is open so you can pick up prescription now. If there is a problem, you may contact your  provider through MyChart messaging and have the prescription routed to another pharmacy.  Your safety is important to us. If you have drug allergies check your prescription carefully.   For the next 24 hours you can use MyChart to ask questions about today's visit, request a non-urgent call back, or ask for a work or school excuse. You will get an email in the next two days asking about your experience. I hope that your e-visit has been valuable and will speed your recovery.   I have spent 5 minutes in review of e-visit questionnaire, review and updating patient chart, medical decision making and response to patient.   Griffey Nicasio Z Ward, PA-C    

## 2022-07-20 DIAGNOSIS — R5383 Other fatigue: Secondary | ICD-10-CM | POA: Diagnosis not present

## 2022-07-20 DIAGNOSIS — Z299 Encounter for prophylactic measures, unspecified: Secondary | ICD-10-CM | POA: Diagnosis not present

## 2022-07-20 DIAGNOSIS — Z6832 Body mass index (BMI) 32.0-32.9, adult: Secondary | ICD-10-CM | POA: Diagnosis not present

## 2022-07-20 DIAGNOSIS — E559 Vitamin D deficiency, unspecified: Secondary | ICD-10-CM | POA: Diagnosis not present

## 2022-07-20 DIAGNOSIS — Z0000 Encounter for general adult medical examination without abnormal findings: Secondary | ICD-10-CM | POA: Diagnosis not present

## 2022-07-20 DIAGNOSIS — Z79899 Other long term (current) drug therapy: Secondary | ICD-10-CM | POA: Diagnosis not present

## 2022-07-20 DIAGNOSIS — Z1331 Encounter for screening for depression: Secondary | ICD-10-CM | POA: Diagnosis not present

## 2022-07-25 ENCOUNTER — Other Ambulatory Visit (HOSPITAL_COMMUNITY): Payer: Self-pay

## 2022-07-25 ENCOUNTER — Other Ambulatory Visit: Payer: Self-pay

## 2022-07-25 MED ORDER — ERGOCALCIFEROL 1.25 MG (50000 UT) PO CAPS
1.0000 | ORAL_CAPSULE | ORAL | 3 refills | Status: DC
  Filled 2022-07-25: qty 12, 84d supply, fill #0
  Filled 2023-01-09 – 2023-01-15 (×2): qty 12, 84d supply, fill #1
  Filled 2023-05-20: qty 12, 84d supply, fill #2
  Filled 2023-07-25 – 2023-07-27 (×2): qty 12, 84d supply, fill #3

## 2022-10-16 ENCOUNTER — Other Ambulatory Visit (HOSPITAL_COMMUNITY): Payer: Self-pay

## 2022-10-16 DIAGNOSIS — Z01419 Encounter for gynecological examination (general) (routine) without abnormal findings: Secondary | ICD-10-CM | POA: Diagnosis not present

## 2022-10-16 DIAGNOSIS — Z6833 Body mass index (BMI) 33.0-33.9, adult: Secondary | ICD-10-CM | POA: Diagnosis not present

## 2022-10-16 DIAGNOSIS — Z1231 Encounter for screening mammogram for malignant neoplasm of breast: Secondary | ICD-10-CM | POA: Diagnosis not present

## 2022-10-16 MED ORDER — ESTRADIOL 0.1 MG/GM VA CREA
0.5000 g | TOPICAL_CREAM | VAGINAL | 12 refills | Status: AC
  Filled 2022-10-16: qty 42.5, 90d supply, fill #0

## 2022-10-17 ENCOUNTER — Other Ambulatory Visit (HOSPITAL_COMMUNITY): Payer: Self-pay

## 2022-10-17 DIAGNOSIS — D225 Melanocytic nevi of trunk: Secondary | ICD-10-CM | POA: Diagnosis not present

## 2022-10-17 DIAGNOSIS — D485 Neoplasm of uncertain behavior of skin: Secondary | ICD-10-CM | POA: Diagnosis not present

## 2022-10-17 DIAGNOSIS — L438 Other lichen planus: Secondary | ICD-10-CM | POA: Diagnosis not present

## 2022-10-17 DIAGNOSIS — L821 Other seborrheic keratosis: Secondary | ICD-10-CM | POA: Diagnosis not present

## 2022-10-17 DIAGNOSIS — L820 Inflamed seborrheic keratosis: Secondary | ICD-10-CM | POA: Diagnosis not present

## 2023-01-09 ENCOUNTER — Other Ambulatory Visit (HOSPITAL_COMMUNITY): Payer: Self-pay

## 2023-01-15 ENCOUNTER — Other Ambulatory Visit (HOSPITAL_COMMUNITY): Payer: Self-pay

## 2023-01-15 ENCOUNTER — Other Ambulatory Visit (HOSPITAL_BASED_OUTPATIENT_CLINIC_OR_DEPARTMENT_OTHER): Payer: Self-pay

## 2023-01-29 DIAGNOSIS — H52221 Regular astigmatism, right eye: Secondary | ICD-10-CM | POA: Diagnosis not present

## 2023-01-29 DIAGNOSIS — H5203 Hypermetropia, bilateral: Secondary | ICD-10-CM | POA: Diagnosis not present

## 2023-01-29 DIAGNOSIS — H524 Presbyopia: Secondary | ICD-10-CM | POA: Diagnosis not present

## 2023-05-21 ENCOUNTER — Other Ambulatory Visit (HOSPITAL_COMMUNITY): Payer: Self-pay

## 2023-07-24 DIAGNOSIS — Z0000 Encounter for general adult medical examination without abnormal findings: Secondary | ICD-10-CM | POA: Diagnosis not present

## 2023-07-24 DIAGNOSIS — R5383 Other fatigue: Secondary | ICD-10-CM | POA: Diagnosis not present

## 2023-07-24 DIAGNOSIS — Z79899 Other long term (current) drug therapy: Secondary | ICD-10-CM | POA: Diagnosis not present

## 2023-07-24 DIAGNOSIS — E559 Vitamin D deficiency, unspecified: Secondary | ICD-10-CM | POA: Diagnosis not present

## 2023-07-24 DIAGNOSIS — Z299 Encounter for prophylactic measures, unspecified: Secondary | ICD-10-CM | POA: Diagnosis not present

## 2023-07-24 DIAGNOSIS — Z1331 Encounter for screening for depression: Secondary | ICD-10-CM | POA: Diagnosis not present

## 2023-07-25 ENCOUNTER — Other Ambulatory Visit (HOSPITAL_COMMUNITY): Payer: Self-pay

## 2023-09-15 ENCOUNTER — Other Ambulatory Visit (HOSPITAL_COMMUNITY): Payer: Self-pay

## 2023-09-18 ENCOUNTER — Other Ambulatory Visit (HOSPITAL_COMMUNITY): Payer: Self-pay

## 2023-09-18 MED ORDER — ERGOCALCIFEROL 1.25 MG (50000 UT) PO CAPS
1.0000 | ORAL_CAPSULE | ORAL | 3 refills | Status: AC
Start: 2023-09-18 — End: ?
  Filled 2023-09-18 (×2): qty 12, 84d supply, fill #0
  Filled 2023-12-30: qty 12, 84d supply, fill #1
  Filled 2024-04-09: qty 12, 84d supply, fill #2
  Filled 2024-06-29: qty 12, 84d supply, fill #3

## 2023-10-26 DIAGNOSIS — Z1231 Encounter for screening mammogram for malignant neoplasm of breast: Secondary | ICD-10-CM | POA: Diagnosis not present

## 2023-10-26 DIAGNOSIS — Z1272 Encounter for screening for malignant neoplasm of vagina: Secondary | ICD-10-CM | POA: Diagnosis not present

## 2023-10-26 DIAGNOSIS — Z01419 Encounter for gynecological examination (general) (routine) without abnormal findings: Secondary | ICD-10-CM | POA: Diagnosis not present

## 2023-10-26 DIAGNOSIS — Z6831 Body mass index (BMI) 31.0-31.9, adult: Secondary | ICD-10-CM | POA: Diagnosis not present

## 2023-12-31 ENCOUNTER — Other Ambulatory Visit (HOSPITAL_COMMUNITY): Payer: Self-pay

## 2024-01-01 ENCOUNTER — Encounter: Payer: Self-pay | Admitting: Pharmacist

## 2024-01-01 ENCOUNTER — Other Ambulatory Visit: Payer: Self-pay

## 2024-01-04 ENCOUNTER — Other Ambulatory Visit (HOSPITAL_COMMUNITY): Payer: Self-pay

## 2024-02-01 DIAGNOSIS — H524 Presbyopia: Secondary | ICD-10-CM | POA: Diagnosis not present

## 2024-02-01 DIAGNOSIS — H5203 Hypermetropia, bilateral: Secondary | ICD-10-CM | POA: Diagnosis not present

## 2024-02-01 DIAGNOSIS — H40001 Preglaucoma, unspecified, right eye: Secondary | ICD-10-CM | POA: Diagnosis not present

## 2024-02-01 DIAGNOSIS — H52223 Regular astigmatism, bilateral: Secondary | ICD-10-CM | POA: Diagnosis not present

## 2024-04-09 ENCOUNTER — Other Ambulatory Visit: Payer: Self-pay

## 2024-04-17 DIAGNOSIS — H2513 Age-related nuclear cataract, bilateral: Secondary | ICD-10-CM | POA: Diagnosis not present

## 2024-04-17 DIAGNOSIS — H40013 Open angle with borderline findings, low risk, bilateral: Secondary | ICD-10-CM | POA: Diagnosis not present

## 2024-06-29 ENCOUNTER — Other Ambulatory Visit (HOSPITAL_COMMUNITY): Payer: Self-pay
